# Patient Record
Sex: Female | Born: 1960
Health system: Southern US, Community
[De-identification: ages and names within clinical notes are randomized; demographics above are authoritative.]

## PROBLEM LIST (undated history)

## (undated) DIAGNOSIS — J45990 Exercise induced bronchospasm: Secondary | ICD-10-CM

## (undated) DIAGNOSIS — J45909 Unspecified asthma, uncomplicated: Secondary | ICD-10-CM

## (undated) DIAGNOSIS — C801 Malignant (primary) neoplasm, unspecified: Secondary | ICD-10-CM

## (undated) DIAGNOSIS — I10 Essential (primary) hypertension: Secondary | ICD-10-CM

## (undated) DIAGNOSIS — C569 Malignant neoplasm of unspecified ovary: Secondary | ICD-10-CM

## (undated) DIAGNOSIS — J309 Allergic rhinitis, unspecified: Secondary | ICD-10-CM

## (undated) HISTORY — PX: TONSILLECTOMY: SUR1361

## (undated) HISTORY — DX: Exercise induced bronchospasm: J45.990

## (undated) HISTORY — PX: ABLATION: SHX5711

## (undated) HISTORY — DX: Allergic rhinitis, unspecified: J30.9

## (undated) HISTORY — PX: APPENDECTOMY: SHX54

## (undated) HISTORY — DX: Malignant neoplasm of unspecified ovary: C56.9

---

## 2006-05-09 ENCOUNTER — Encounter: Admission: RE | Admit: 2006-05-09 | Discharge: 2006-05-09 | Payer: Self-pay | Admitting: Family Medicine

## 2008-03-31 ENCOUNTER — Ambulatory Visit (HOSPITAL_COMMUNITY): Admission: RE | Admit: 2008-03-31 | Discharge: 2008-03-31 | Payer: Self-pay | Admitting: Obstetrics and Gynecology

## 2008-03-31 ENCOUNTER — Encounter (INDEPENDENT_AMBULATORY_CARE_PROVIDER_SITE_OTHER): Payer: Self-pay | Admitting: Obstetrics and Gynecology

## 2010-03-12 ENCOUNTER — Encounter: Payer: Self-pay | Admitting: Family Medicine

## 2010-06-05 LAB — COMPREHENSIVE METABOLIC PANEL
ALT: 20 U/L (ref 0–35)
AST: 20 U/L (ref 0–37)
Albumin: 3.8 g/dL (ref 3.5–5.2)
Alkaline Phosphatase: 57 U/L (ref 39–117)
BUN: 13 mg/dL (ref 6–23)
CO2: 26 mEq/L (ref 19–32)
Calcium: 9.1 mg/dL (ref 8.4–10.5)
Chloride: 107 mEq/L (ref 96–112)
Creatinine, Ser: 0.6 mg/dL (ref 0.4–1.2)
GFR calc Af Amer: >60 mL/min (ref >60)
GFR calc non Af Amer: >60 mL/min (ref >60)
Glucose, Bld: 115 mg/dL — ABNORMAL HIGH (ref 70–99)
Potassium: 3.9 mEq/L (ref 3.5–5.1)
Sodium: 140 mEq/L (ref 135–145)
Total Bilirubin: 0.5 mg/dL (ref 0.3–1.2)
Total Protein: 6.7 g/dL (ref 6.0–8.3)

## 2010-06-05 LAB — CBC
HCT: 39.7 % (ref 36.0–46.0)
Hemoglobin: 13.4 g/dL (ref 12.0–15.0)
MCHC: 33.9 g/dL (ref 30.0–36.0)
MCV: 92.7 fL (ref 78.0–100.0)
Platelets: 192 10*3/uL (ref 150–400)
RBC: 4.28 MIL/uL (ref 3.87–5.11)
RDW: 13.1 % (ref 11.5–15.5)
WBC: 5.6 10*3/uL (ref 4.0–10.5)

## 2010-06-05 LAB — HCG, SERUM, QUALITATIVE: Preg, Serum: NEGATIVE

## 2010-07-03 NOTE — Op Note (Signed)
Morgan Olsen                    ACCOUNT NO.:  [id number]   MEDICAL RECORD NO.:  [id number]          PATIENT TYPE:  AMB   LOCATION:  SDC                           FACILITY:  [facility]   PHYSICIAN:  Guy Sandifer. Henderson Cloud, M.D. DATE OF BIRTH:  16-Feb-1961   DATE OF PROCEDURE:  03/31/2008  DATE OF DISCHARGE:                               OPERATIVE REPORT   PREOPERATIVE DIAGNOSES:  1. Menorrhagia.  2. Desires permanent sterilization.   POSTOPERATIVE DIAGNOSES:  1. Menorrhagia.  2. Desires permanent sterilization.  3. Endometriosis.   PROCEDURES:  NovaSure endometrial ablation, hysteroscopy with resection  of endometrial masses, dilatation and curettage, laparoscopy with  bilateral tubal ligation with Filshie clips, and ablation of  endometriosis.   SURGEON:  Guy Sandifer. Henderson Cloud, MD   ANESTHESIA:  General endotracheal intubation, Algie Coffer, MD   SPECIMENS:  Endometrial resections and endometrial curettings, both to  pathology.   ESTIMATED BLOOD LOSS:  Minimal.   I and Os of distending media 190 mL deficit with a lot of that being on  the floor.   INDICATIONS AND CONSENT:  This patient is a 50 year old married white  female G1, P1 with increasingly heavy menses.  Details are dictated in  history and physical.  After a thorough discussion of options, she is  being admitted for hysteroscopy with resectoscope, dilatation curettage,  NovaSure endometrial ablation, laparoscopy, and tubal ligation with  Filshie clips.  Potential risks and complications of the above has been  discussed preoperatively including, but not limited to infection,  uterine perforation, organ damage, bleeding requiring transfusion of  blood products with HIV and hepatitis acquisition, DVT, PE, pneumonia,  laparotomy, postoperative pain, or dyspareunia.  Success and failure  rate of the ablation has been reviewed.  The permanence of tubal  ligation failure rate and increased ectopic risk has also been reviewed.  All  questions have been answered and consent is signed on the chart.   FINDINGS:  Upper abdomen has a single omental adhesion above the level  of the umbilicus.  Uterus is about 6 weeks in size.  Fallopian tubes and  ovaries are normal bilaterally.  There are multiple white puckered  implants with small black 3-mm endometrial implants on the left pelvic  sidewall and on the left side of the posterior cul-de-sac.  In the  uterus, there are multiple polypoid-type structures along the posterior  endometrial canal.   PROCEDURE:  The patient was taken to operating room where she was  identified, placed in dorsosupine position, and general anesthesia was  induced via endotracheal intubation.  She was then placed in a dorsal  lithotomy position where she was prepped abdominally and vaginally,  straight catheterized, and draped in a sterile fashion.  Bivalve  speculum was placed in the vagina and anterior cervical lip was injected  with 0.5% plain Marcaine and grasped with a single-tooth tenaculum.  Paracervical block was placed at 2, 4, 5, 7, 8, and 10 o'clock positions  with approximately 20 mL of the same solution.  The uterus sounds to 10  cm and the cervix sounds  to 4 cm.  Cervix was gently progressively  dilated.  The resectoscope was placed in the endocervical canal and  advanced under direct visualization with distending media.  The above  findings were noted.  The polypoid-type structures were resected with a  single right-angle wire loop.  These were all sent separately to  Pathology.  Sharp curettage was then carried out.  Re-inspection with  the hysteroscope again advanced under direct visualization revealed the  cavity to be clean.  The NovaSure device was then placed.  The cavity  test was passed on the first attempt.  Ablation was then carried out and  completed at 1 minute and 21 seconds.  NovaSure device was removed and  inspection revealed it to be intact.  The hysteroscope was  replaced in  the endocervical canal and again advanced under direct visualization.  There was a good cautery effect throughout the uterus and no evidence of  perforation.  The single-tooth tenaculum was replaced with a Hulka  tenaculum and attention was turned to the abdomen.  The infraumbilical  and suprapubic areas were injected in the midline with 0.5% plain  Marcaine.  A small infraumbilical incision was made and a disposable  Veress needle was placed.  A normal syringe and drop test were noted.  Two liters of gas were then insufflated under low pressure with good  tympany in the right upper quadrant.  Veress needle was removed and a  10/11 XL bladeless disposable trocar sleeve was placed using direct  visualization with the diagnostic laparoscope after the operative  laparoscope was placed.  Small suprapubic incision was made in the  midline and a 5-mm XL bladeless disposable trocar sleeve was placed  under direct visualization without difficulty.  The above findings were  noted.  Filshie clip applicator was then placed and a Filshie clip was  placed in the proximal one-third of the tubes bilaterally.  The  applicator was then removed and careful inspection revealed the entire  width of the tube to be within the clip and the heel of the clip to be  visualized through the mesosalpinx.  Then, bipolar cautery was used to  cauterize the endometriosis on the left pelvic sidewall and left cul-de-  sac.  The course of the left ureter was carefully identified and no  cautery was done over the course of the ureter.  Good hemostasis was  noted all around.  Five mL of 0.5% plain Marcaine was then instilled  into the peritoneal cavity.  All instruments were removed and  pneumoperitoneum was then reduced.  Trocar sleeves were removed.  The  umbilical incision was closed with 0-Vicryl suture and the deeper  underlying layers with good visualization.  Both skin incisions were  closed with  subcuticular 2-0 Vicryl suture.  Dermabond was placed on  both incisions.  Hulka tenaculum was removed and no bleeding was noted.  All counts were correct.  The patient was awakened and taken to recovery  room in stable condition.      Guy Sandifer Henderson Cloud, M.D.  Electronically Signed     JET/MEDQ  D:  03/31/2008  T:  03/31/2008  Job:  [id number]

## 2010-07-03 NOTE — H&P (Signed)
NAMERENAY, CRAMMER                    ACCOUNT NO.:  [id number]   MEDICAL RECORD NO.:  [id number]          PATIENT TYPE:  AMB   LOCATION:  SDC                           FACILITY:  [facility]   PHYSICIAN:  Guy Sandifer. Henderson Cloud, M.D. DATE OF BIRTH:  August 12, 1960   DATE OF ADMISSION:  DATE OF DISCHARGE:                              HISTORY & PHYSICAL   For admission at [facility] [place], [place] on March 31, 2008.   CHIEF COMPLAINT:  Heavy menses and desires permanent sterilization.   HISTORY OF PRESENT ILLNESS:  This patient is a 50 year old married white  female G1, P1 who has increasingly heavy menses.  She will have 3 days  of heavy bleeding changing a pad plus a tampon every 2-3 hours.  She is  overflowing at night.  Ultrasound in my office on January 07, 2008,  revealed a uterus measuring 10.0 x 5.7 x 6.5 cm.  There is a 2.0-cm  simple cyst to the right ovary.  Sonohysterogram revealed 17 and 8-mm  polypoid-type structures within the endometrial cavity.  After  discussion of options, she is being admitted for hysteroscopy with  resectoscope dilatation and curettage, NovaSure endometrial ablation,  laparoscopy with bilateral tubal ligation with Filshie clips.  Potential  risks and complications, success and failure rates, permanence of the  above procedures has been reviewed preoperatively.   PAST MEDICAL HISTORY:  1. Asthma.  2. Chronic hypertension.  3. History of melanoma in situ.   MEDICATIONS:  Generic Lotrel, Singulair daily.   ALLERGIES:  SULFA medicines leading to rash.  The patient states that  she has been diagnosed with PEANUT allergy, although she is able to eat  peanut better with no troubles.   PAST SURGICAL HISTORY:  Tonsillectomy and wisdom tooth extraction.   OBSTETRICAL HISTORY:  Cesarean section x1.   FAMILY HISTORY:  Chronic hypertension in father, thyroid cancer in  mother.   SOCIAL HISTORY:  Denies tobacco, alcohol, or drug  abuse.   REVIEW OF SYSTEMS:  NEURO:  Denies headache.  CARDIO:  Denies chest  pain.  PULMONARY:  Denies shortness of breath.   PHYSICAL EXAMINATION:  VITAL SIGNS:  Height 5 feet 10 inches, weight 185  pounds, blood pressure 138/90.  LUNGS:  Clear to auscultation.  HEART:  Regular rate and rhythm.  ABDOMEN:  Soft, nontender without masses.  PELVIS:  Vulva, vagina, and cervix without lesion.  Uterus is midplane.  Adnexa nontender without palpable masses.  EXTREMITIES:  Grossly within  normal limits.  NEUROLOGICAL:  Exam grossly within normal limits.   ASSESSMENT:  Menorrhagia and desires permanent sterilization.   PLAN:  Hysteroscopy, resectoscope dilatation and curettage, NovaSure  endometrial ablation, and laparoscopy with bilateral tubal ligation with  Filshie clips.      Guy Sandifer Henderson Cloud, M.D.  Electronically Signed     JET/MEDQ  D:  03/28/2008  T:  03/29/2008  Job:  [id number]

## 2012-07-08 ENCOUNTER — Encounter (HOSPITAL_BASED_OUTPATIENT_CLINIC_OR_DEPARTMENT_OTHER): Payer: Self-pay | Admitting: *Deleted

## 2012-07-08 ENCOUNTER — Emergency Department (HOSPITAL_BASED_OUTPATIENT_CLINIC_OR_DEPARTMENT_OTHER)
Admission: EM | Admit: 2012-07-08 | Discharge: 2012-07-08 | Disposition: A | Discharge status: Home / Self Care | Payer: BC Managed Care – PPO | Attending: Emergency Medicine | Admitting: Emergency Medicine

## 2012-07-08 ENCOUNTER — Emergency Department (HOSPITAL_BASED_OUTPATIENT_CLINIC_OR_DEPARTMENT_OTHER): Payer: BC Managed Care – PPO

## 2012-07-08 DIAGNOSIS — Z79899 Other long term (current) drug therapy: Secondary | ICD-10-CM | POA: Insufficient documentation

## 2012-07-08 DIAGNOSIS — J45909 Unspecified asthma, uncomplicated: Secondary | ICD-10-CM | POA: Insufficient documentation

## 2012-07-08 DIAGNOSIS — N839 Noninflammatory disorder of ovary, fallopian tube and broad ligament, unspecified: Secondary | ICD-10-CM | POA: Insufficient documentation

## 2012-07-08 DIAGNOSIS — N838 Other noninflammatory disorders of ovary, fallopian tube and broad ligament: Secondary | ICD-10-CM

## 2012-07-08 DIAGNOSIS — I10 Essential (primary) hypertension: Secondary | ICD-10-CM | POA: Insufficient documentation

## 2012-07-08 DIAGNOSIS — Z859 Personal history of malignant neoplasm, unspecified: Secondary | ICD-10-CM | POA: Insufficient documentation

## 2012-07-08 HISTORY — DX: Essential (primary) hypertension: I10

## 2012-07-08 HISTORY — DX: Unspecified asthma, uncomplicated: J45.909

## 2012-07-08 HISTORY — DX: Malignant (primary) neoplasm, unspecified: C80.1

## 2012-07-08 LAB — CBC WITH DIFFERENTIAL/PLATELET
Basophils Absolute: 0 10*3/uL (ref 0.0–0.1)
Basophils Relative: 0 % (ref 0–1)
Eosinophils Absolute: 0 10*3/uL (ref 0.0–0.7)
Eosinophils Relative: 0 % (ref 0–5)
HCT: 39.1 % (ref 36.0–46.0)
Hemoglobin: 13.4 g/dL (ref 12.0–15.0)
Lymphocytes Relative: 12 % (ref 12–46)
Lymphs Abs: 1.1 10*3/uL (ref 0.7–4.0)
MCH: 31.1 pg (ref 26.0–34.0)
MCHC: 34.3 g/dL (ref 30.0–36.0)
MCV: 90.7 fL (ref 78.0–100.0)
Monocytes Absolute: 0.3 10*3/uL (ref 0.1–1.0)
Monocytes Relative: 3 % (ref 3–12)
Neutro Abs: 7.7 10*3/uL (ref 1.7–7.7)
Neutrophils Relative %: 85 % — ABNORMAL HIGH (ref 43–77)
Platelets: 217 10*3/uL (ref 150–400)
RBC: 4.31 MIL/uL (ref 3.87–5.11)
RDW: 12.2 % (ref 11.5–15.5)
WBC: 9 10*3/uL (ref 4.0–10.5)

## 2012-07-08 LAB — URINALYSIS, ROUTINE W REFLEX MICROSCOPIC
Bilirubin Urine: NEGATIVE
Glucose, UA: NEGATIVE mg/dL
Hgb urine dipstick: NEGATIVE
Ketones, ur: 40 mg/dL — AB
Leukocytes, UA: NEGATIVE
Nitrite: NEGATIVE
Protein, ur: NEGATIVE mg/dL
Specific Gravity, Urine: 1.026 (ref 1.005–1.030)
Urobilinogen, UA: 1 mg/dL (ref 0.0–1.0)
pH: 5.5 (ref 5.0–8.0)

## 2012-07-08 LAB — COMPREHENSIVE METABOLIC PANEL
ALT: 16 U/L (ref 0–35)
AST: 15 U/L (ref 0–37)
Albumin: 3.8 g/dL (ref 3.5–5.2)
Alkaline Phosphatase: 73 U/L (ref 39–117)
BUN: 13 mg/dL (ref 6–23)
CO2: 26 mEq/L (ref 19–32)
Calcium: 9.7 mg/dL (ref 8.4–10.5)
Chloride: 100 mEq/L (ref 96–112)
Creatinine, Ser: 0.7 mg/dL (ref 0.50–1.10)
GFR calc Af Amer: >90 mL/min (ref >90)
GFR calc non Af Amer: >90 mL/min (ref >90)
Glucose, Bld: 112 mg/dL — ABNORMAL HIGH (ref 70–99)
Potassium: 3.8 mEq/L (ref 3.5–5.1)
Sodium: 138 mEq/L (ref 135–145)
Total Bilirubin: 0.3 mg/dL (ref 0.3–1.2)
Total Protein: 7 g/dL (ref 6.0–8.3)

## 2012-07-08 LAB — LIPASE, BLOOD: Lipase: 21 U/L (ref 11–59)

## 2012-07-08 MED ORDER — HYDROCODONE-ACETAMINOPHEN 5-325 MG PO TABS: 2.0000 | ORAL_TABLET | ORAL | Status: DC | PRN

## 2012-07-08 MED ORDER — IOHEXOL 300 MG/ML  SOLN
100.0000 mL | Freq: Once | INTRAMUSCULAR | Status: AC | PRN
  Administered 2012-07-08: 100 mL via INTRAVENOUS

## 2012-07-08 MED ORDER — LORAZEPAM 1 MG PO TABS: 1.0000 mg | ORAL_TABLET | Freq: Three times a day (TID) | ORAL | Status: DC | PRN

## 2012-07-08 MED ORDER — SODIUM CHLORIDE 0.9 % IV SOLN
Freq: Once | INTRAVENOUS | Status: AC
  Administered 2012-07-08: 18:00:00 via INTRAVENOUS

## 2012-07-08 MED ORDER — ONDANSETRON 4 MG PO TBDP: 4.0000 mg | ORAL_TABLET | Freq: Three times a day (TID) | ORAL | Status: DC | PRN

## 2012-07-08 MED ORDER — LORAZEPAM 1 MG PO TABS
1.0000 mg | ORAL_TABLET | Freq: Once | ORAL | Status: AC
  Administered 2012-07-08: 1 mg via ORAL
  Filled 2012-07-08: qty 1

## 2012-07-08 MED ORDER — IOHEXOL 300 MG/ML  SOLN
50.0000 mL | Freq: Once | INTRAMUSCULAR | Status: AC | PRN
  Administered 2012-07-08: 50 mL via ORAL

## 2012-07-08 NOTE — ED Notes (Signed)
Pt c/o severe abd pain x 1 hr with nausea only

## 2012-07-08 NOTE — ED Provider Notes (Signed)
History     CSN: [id number]  Arrival date & time 07/08/12  1553   First MD Initiated Contact with Patient 07/08/12 1615      Chief Complaint  Patient presents with  . Abdominal Pain    (Consider location/radiation/quality/duration/timing/severity/associated sxs/prior treatment) Patient is a 52 y.o. female presenting with abdominal pain. The history is provided by the patient. No language interpreter was used.  Abdominal Pain This is a new problem. The current episode started today. The problem occurs constantly. The problem has been gradually improving. Associated symptoms include abdominal pain. Nothing aggravates the symptoms. She has tried nothing for the symptoms.  Pt complains of severe left lower abdominal pain for the last hour.  Pt reports she has had similar episodes in the past.  Pt reports gi illness 2 months ago,  Pt reports abdominal swelling and bloating since.  Pt reports pain is starting to resolve.    Past Medical History  Diagnosis Date  . Hypertension   . Asthma   . Cancer     Past Surgical History  Procedure Laterality Date  . Cesarean section    . Tonsillectomy    . Ablation      History reviewed. No pertinent family history.  History  Substance Use Topics  . Smoking status: Never Smoker   . Smokeless tobacco: Not on file  . Alcohol Use: No    OB History   Grav Para Term Preterm Abortions TAB SAB Ect Mult Living                  Review of Systems  Gastrointestinal: Positive for abdominal pain.  All other systems reviewed and are negative.    Allergies  Sulfa antibiotics  Home Medications   Current Outpatient Rx  Name  Route  Sig  Dispense  Refill  . amLODipine-benazepril (LOTREL) 5-40 MG per capsule   Oral   Take 1 capsule by mouth daily.         . montelukast (SINGULAIR) 10 MG tablet   Oral   Take 10 mg by mouth at bedtime.           BP 130/83  Pulse 91  Temp(Src) 98.2 F (36.8 C) (Oral)  Resp 16  Ht 5\' 10"  (1.778  m)  Wt 177 lb (80.287 kg)  BMI 25.4 kg/m2  SpO2 100%  Physical Exam  Nursing note and vitals reviewed. Constitutional: She appears well-developed and well-nourished.  HENT:  Head: Normocephalic.  Right Ear: External ear normal.  Left Ear: External ear normal.  Nose: Nose normal.  Mouth/Throat: Oropharynx is clear and moist.  Eyes: Conjunctivae are normal. Pupils are equal, round, and reactive to light.  Neck: Normal range of motion. Neck supple.  Cardiovascular: Normal rate and regular rhythm.   Pulmonary/Chest: Effort normal and breath sounds normal.  Abdominal: Soft. There is tenderness.  Musculoskeletal: Normal range of motion.  Neurological: She is alert.  Skin: Skin is warm.  Psychiatric: She has a normal mood and affect.    ED Course  Procedures (including critical care time)  Labs Reviewed  URINALYSIS, ROUTINE W REFLEX MICROSCOPIC - Abnormal; Notable for the following:    Color, Urine AMBER (*)    APPearance CLOUDY (*)    Ketones, ur 40 (*)    All other components within normal limits  CBC WITH DIFFERENTIAL - Abnormal; Notable for the following:    Neutrophils Relative % 85 (*)    All other components within normal limits  COMPREHENSIVE METABOLIC PANEL -  Abnormal; Notable for the following:    Glucose, Bld 112 (*)    All other components within normal limits  LIPASE, BLOOD   No results found.   No diagnosis found.    MDM   Results for orders placed during the hospital encounter of 07/08/12  URINALYSIS, ROUTINE W REFLEX MICROSCOPIC      Result Value Range   Color, Urine AMBER (*) YELLOW   APPearance CLOUDY (*) CLEAR   Specific Gravity, Urine 1.026  1.005 - 1.030   pH 5.5  5.0 - 8.0   Glucose, UA NEGATIVE  NEGATIVE mg/dL   Hgb urine dipstick NEGATIVE  NEGATIVE   Bilirubin Urine NEGATIVE  NEGATIVE   Ketones, ur 40 (*) NEGATIVE mg/dL   Protein, ur NEGATIVE  NEGATIVE mg/dL   Urobilinogen, UA 1.0  0.0 - 1.0 mg/dL   Nitrite NEGATIVE  NEGATIVE    Leukocytes, UA NEGATIVE  NEGATIVE  CBC WITH DIFFERENTIAL      Result Value Range   WBC 9.0  4.0 - 10.5 K/uL   RBC 4.31  3.87 - 5.11 MIL/uL   Hemoglobin 13.4  12.0 - 15.0 g/dL   HCT 16.1  09.6 - 04.5 %   MCV 90.7  78.0 - 100.0 fL   MCH 31.1  26.0 - 34.0 pg   MCHC 34.3  30.0 - 36.0 g/dL   RDW 40.9  81.1 - 91.4 %   Platelets 217  150 - 400 K/uL   Neutrophils Relative % 85 (*) 43 - 77 %   Neutro Abs 7.7  1.7 - 7.7 K/uL   Lymphocytes Relative 12  12 - 46 %   Lymphs Abs 1.1  0.7 - 4.0 K/uL   Monocytes Relative 3  3 - 12 %   Monocytes Absolute 0.3  0.1 - 1.0 K/uL   Eosinophils Relative 0  0 - 5 %   Eosinophils Absolute 0.0  0.0 - 0.7 K/uL   Basophils Relative 0  0 - 1 %   Basophils Absolute 0.0  0.0 - 0.1 K/uL  COMPREHENSIVE METABOLIC PANEL      Result Value Range   Sodium 138  135 - 145 mEq/L   Potassium 3.8  3.5 - 5.1 mEq/L   Chloride 100  96 - 112 mEq/L   CO2 26  19 - 32 mEq/L   Glucose, Bld 112 (*) 70 - 99 mg/dL   BUN 13  6 - 23 mg/dL   Creatinine, Ser 7.82  0.50 - 1.10 mg/dL   Calcium 9.7  8.4 - 95.6 mg/dL   Total Protein 7.0  6.0 - 8.3 g/dL   Albumin 3.8  3.5 - 5.2 g/dL   AST 15  0 - 37 U/L   ALT 16  0 - 35 U/L   Alkaline Phosphatase 73  39 - 117 U/L   Total Bilirubin 0.3  0.3 - 1.2 mg/dL   GFR calc non Af Amer >90  >90 mL/min   GFR calc Af Amer >90  >90 mL/min  LIPASE, BLOOD      Result Value Range   Lipase 21  11 - 59 U/L   Ct Abdomen Pelvis W Contrast  07/08/2012   *RADIOLOGY REPORT*  Clinical Data: Infrapubic pelvic pain  CT ABDOMEN AND PELVIS WITH CONTRAST  Technique:  Multidetector CT imaging of the abdomen and pelvis was performed following the standard protocol during bolus administration of intravenous contrast.  Contrast: 100 ml Omnipaque-300 IV  Comparison: None.  Findings: Lung bases are essentially  clear.  Small hiatal hernia.  Liver, spleen, pancreas, and adrenal glands are within normal limits.  Gallbladder is underdistended.  No intrahepatic or extrahepatic  ductal dilatation.  Kidneys are within normal limits.  No hydronephrosis.  No evidence of bowel obstruction.  Normal appendix.  Transverse and descending colon are decompressed.  No evidence of abdominal aortic aneurysm.  Small retroperitoneal lymph nodes which do not meet pathologic CT size criteria.  11.6 x 10.1 x 10.1 cm complex cystic left ovarian mass (series 2/image 70) with associated mural nodularity/soft tissue component, worrisome for malignant ovarian neoplasm.  Associated mild mesenteric/omental stranding and small volume abdominopelvic ascites.  Possible mild peritoneal nodularity in the right lower abdomen, measuring 7 mm (series 2/image 70), worrisome for peritoneal disease.  Uterus and right ovary are grossly unremarkable.  Bladder is within normal limits.  Degenerative changes of the visualized thoracolumbar spine.  IMPRESSION: 11.6 cm left ovarian mass, worrisome for malignant ovarian neoplasm.  Associated small volume abdominopelvic ascites and possible mild peritoneal nodularity, worrisome for peritoneal disease.  These results were called by telephone on 07/08/2012 at 1820 hours to Dr Ranae Palms, who verbally acknowledged these results.   Original Report Authenticated By: Charline Bills, M.D.     I spoke to Dr. Marcelle Overlie who is on call for Dr. [name].  He advised have pt call the office in the am to schedule to see Dr. [name].   Pt given Rx for hydrocodone for pain, zofran for nausea.   Pt is advised if symptoms worsen of change she needs to go to [facility] hospital for evaluation.      Lonia Skinner [place], PA-C 07/08/12 1909

## 2012-07-08 NOTE — ED Notes (Signed)
PA at bedside discussing DC plan of care.  

## 2012-07-09 NOTE — ED Provider Notes (Signed)
Medical screening examination/treatment/procedure(s) were performed by non-physician practitioner and as supervising physician I was immediately available for consultation/collaboration.   Ivi Griffith, MD 07/09/12 1858 

## 2012-07-15 ENCOUNTER — Ambulatory Visit: Payer: BC Managed Care – PPO | Admitting: Gynecology

## 2012-07-15 DIAGNOSIS — C569 Malignant neoplasm of unspecified ovary: Secondary | ICD-10-CM | POA: Insufficient documentation

## 2012-07-15 HISTORY — PX: ABDOMINAL HYSTERECTOMY: SHX81

## 2012-07-27 ENCOUNTER — Other Ambulatory Visit: Payer: Self-pay | Admitting: Gynecologic Oncology

## 2012-07-27 DIAGNOSIS — C569 Malignant neoplasm of unspecified ovary: Secondary | ICD-10-CM

## 2012-07-27 NOTE — Progress Notes (Signed)
Arranged per Dr. Forrestine Him request.  Patient would like to have cycle #1 at [facility] this week  (port placement on 6/11 and tax/carb on 6/12), but see Dr. Welton Flakes on 6/17 for future visits per [facility] staff.

## 2012-07-28 ENCOUNTER — Telehealth: Payer: Self-pay | Admitting: Oncology

## 2012-07-28 NOTE — Telephone Encounter (Signed)
07/28/12 for appt. 08/04/12

## 2012-07-31 ENCOUNTER — Other Ambulatory Visit: Payer: Self-pay | Admitting: Emergency Medicine

## 2012-08-03 ENCOUNTER — Other Ambulatory Visit: Payer: Self-pay | Admitting: Emergency Medicine

## 2012-08-03 DIAGNOSIS — C569 Malignant neoplasm of unspecified ovary: Secondary | ICD-10-CM

## 2012-08-04 ENCOUNTER — Ambulatory Visit: Payer: BC Managed Care – PPO

## 2012-08-04 ENCOUNTER — Telehealth: Payer: Self-pay | Admitting: *Deleted

## 2012-08-04 ENCOUNTER — Other Ambulatory Visit: Payer: Self-pay | Admitting: Emergency Medicine

## 2012-08-04 ENCOUNTER — Encounter: Payer: Self-pay | Admitting: Oncology

## 2012-08-04 ENCOUNTER — Other Ambulatory Visit (HOSPITAL_BASED_OUTPATIENT_CLINIC_OR_DEPARTMENT_OTHER): Payer: BC Managed Care – PPO | Admitting: Lab

## 2012-08-04 ENCOUNTER — Ambulatory Visit (HOSPITAL_BASED_OUTPATIENT_CLINIC_OR_DEPARTMENT_OTHER): Payer: BC Managed Care – PPO | Admitting: Oncology

## 2012-08-04 VITALS — BP 118/86 | HR 99 | Temp 98.1°F | Resp 20 | Ht 70.0 in | Wt 166.7 lb

## 2012-08-04 DIAGNOSIS — J45990 Exercise induced bronchospasm: Secondary | ICD-10-CM | POA: Insufficient documentation

## 2012-08-04 DIAGNOSIS — I10 Essential (primary) hypertension: Secondary | ICD-10-CM

## 2012-08-04 DIAGNOSIS — C569 Malignant neoplasm of unspecified ovary: Secondary | ICD-10-CM

## 2012-08-04 DIAGNOSIS — C562 Malignant neoplasm of left ovary: Secondary | ICD-10-CM

## 2012-08-04 HISTORY — DX: Essential (primary) hypertension: I10

## 2012-08-04 HISTORY — DX: Exercise induced bronchospasm: J45.990

## 2012-08-04 LAB — CBC WITH DIFFERENTIAL/PLATELET
BASO%: 1.4 % (ref 0.0–2.0)
Basophils Absolute: 0 10*3/uL (ref 0.0–0.1)
EOS%: 0 % (ref 0.0–7.0)
Eosinophils Absolute: 0 10*3/uL (ref 0.0–0.5)
HCT: 39.1 % (ref 34.8–46.6)
HGB: 13.2 g/dL (ref 11.6–15.9)
LYMPH%: 45.8 % (ref 14.0–49.7)
MCH: 30.1 pg (ref 25.1–34.0)
MCHC: 33.7 g/dL (ref 31.5–36.0)
MCV: 89.4 fL (ref 79.5–101.0)
MONO#: 0.1 10*3/uL (ref 0.1–0.9)
MONO%: 1.6 % (ref 0.0–14.0)
NEUT#: 1.8 10*3/uL (ref 1.5–6.5)
NEUT%: 51.2 % (ref 38.4–76.8)
Platelets: 266 10*3/uL (ref 145–400)
RBC: 4.37 10*6/uL (ref 3.70–5.45)
RDW: 13.5 % (ref 11.2–14.5)
WBC: 3.5 10*3/uL — ABNORMAL LOW (ref 3.9–10.3)
lymph#: 1.6 10*3/uL (ref 0.9–3.3)

## 2012-08-04 LAB — COMPREHENSIVE METABOLIC PANEL (CC13)
ALT: 38 U/L (ref 0–55)
AST: 23 U/L (ref 5–34)
Albumin: 4 g/dL (ref 3.5–5.0)
Alkaline Phosphatase: 67 U/L (ref 40–150)
BUN: 19.2 mg/dL (ref 7.0–26.0)
CO2: 26 mEq/L (ref 22–29)
Calcium: 10.6 mg/dL — ABNORMAL HIGH (ref 8.4–10.4)
Chloride: 99 mEq/L (ref 98–107)
Creatinine: 0.8 mg/dL (ref 0.6–1.1)
Glucose: 104 mg/dl — ABNORMAL HIGH (ref 70–99)
Potassium: 4.3 mEq/L (ref 3.5–5.1)
Sodium: 136 mEq/L (ref 136–145)
Total Bilirubin: 0.51 mg/dL (ref 0.20–1.20)
Total Protein: 7.5 g/dL (ref 6.4–8.3)

## 2012-08-04 MED ORDER — PROCHLORPERAZINE MALEATE 10 MG PO TABS: 10.0000 mg | ORAL_TABLET | Freq: Four times a day (QID) | ORAL | Status: DC | PRN

## 2012-08-04 MED ORDER — ONDANSETRON HCL 8 MG PO TABS: 8.0000 mg | ORAL_TABLET | Freq: Two times a day (BID) | ORAL | Status: DC

## 2012-08-04 MED ORDER — LORAZEPAM 0.5 MG PO TABS: 0.5000 mg | ORAL_TABLET | Freq: Four times a day (QID) | ORAL | Status: DC | PRN

## 2012-08-04 MED ORDER — LIDOCAINE-PRILOCAINE 2.5-2.5 % EX CREA: TOPICAL_CREAM | CUTANEOUS | Status: DC | PRN

## 2012-08-04 MED ORDER — VENLAFAXINE HCL ER 37.5 MG PO CP24: 37.5000 mg | ORAL_CAPSULE | Freq: Every day | ORAL | Status: DC

## 2012-08-04 MED ORDER — PROCHLORPERAZINE 25 MG RE SUPP: 25.0000 mg | Freq: Two times a day (BID) | RECTAL | Status: DC | PRN

## 2012-08-04 MED ORDER — ALPRAZOLAM 0.25 MG PO TABS: 0.2500 mg | ORAL_TABLET | Freq: Every evening | ORAL | Status: DC | PRN

## 2012-08-04 MED ORDER — GABAPENTIN 100 MG PO CAPS: 100.0000 mg | ORAL_CAPSULE | Freq: Three times a day (TID) | ORAL | Status: DC

## 2012-08-04 MED ORDER — OXYCODONE HCL 5 MG PO CAPS: 5.0000 mg | ORAL_CAPSULE | Freq: Four times a day (QID) | ORAL | Status: DC | PRN

## 2012-08-04 MED ORDER — DEXAMETHASONE 4 MG PO TABS: 8.0000 mg | ORAL_TABLET | Freq: Two times a day (BID) | ORAL | Status: DC

## 2012-08-04 NOTE — Telephone Encounter (Signed)
appts made and printed. Pt is aware that im waiting on KK to give me a time slot for 08/20/12. Pt is aware that i emailed KK for a time slot., and I emailed MW to add tx. i also informed pt that i will giver her a call when everything was complete and mail a new AVS..td

## 2012-08-04 NOTE — Progress Notes (Signed)
[facility]  Telephone:([phone] Fax:([phone]   MEDICAL ONCOLOGY - INITIAL CONSULATION    Referral MD   Dr. Toniann Fail. 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 New Evaluation  : 07/15/2012  :Stage II B. carcinosarcoma of the ovary  Status post surgery on 07/15/2012 consisting of total on a hysterectomy bilateral salpingo-oophorectomy, ovarian cancer staging, appendectomy   HPI: Patient is a very pleasant 52 year old female who is seen today square see from Dr. [name]. Patient originally presented to the ER with pelvic pain in May 2014. She states that she first noticed this pain briefly in 2012 but it was self-limited. She never saw any care. She however continued to have occasional episodes of debilitating pelvic pain that will go if after a few minutes. However in May she had an episode of this pain was acute onset severe lower abdominal/pelvic pain accompanied by nausea and cold sweats. It did not resolve spontaneously showed she presented to the ER for further evaluation. She had a CT scan performed in the emergency department that showed an 11 cm complex left ovarian mass with mild mesenteric/omental stranding, small ascites and peritoneal nodularity. Because of this she was referred to Dr. [name] who performed a transvaginal ultrasound confirming a 11.5 cm complex left ovarian mass no free fluid was noted and the uterus was normal. She had a CEA 125 checked and it was elevated at 1600. She subsequently was seen by Dr. [name] and she underwent on 07/15/2012 total [name] hysterectomy bilateral salpingo-oophorectomy ovarian cancer staging and appendectomy. Intraoperatively patient was found to have a 10 cm left ovary with a cyst containing or blood. The ovarian cyst was hard he ruptured upon entry to the abdomen. The frozen section revealed an  adenocarcinoma of the ovary. The left ovarian mass was adherent to the uterine fundus and the sigmoid colon mesentery. The left ureter was dilated to 1 cm prior to the mass removal. The pathology showed a carcinosarcoma, [name] is type, consisting of mixed endometrioid, clear cell, mucinous, and squamous cell carcinoma components and [name] sarcomatous component. Tumor necrosis and acute hemorrhage is present tumor size was 10.5 x 8.0 x 8.5 cm no ovarian surface involvement was identified no lymphovascular invasion identified. Left fallopian tube the serosal fibrovascular decisions were noted mild chronic salpingitis no tumor was seen. Tumor did involve left ovary and periureteral nodule only. Her surgery was performed at [facility]. She was seen by oncology there and patient was started on carboplatinum plus paclitaxel. Overall she tolerated it well. She is now transferring her care to [facility] for continuation of her adjuvant chemotherapy. Patient does have some psychosocial issues due to stress and anxiety management. She did have a cycle oncology consult obtained during her visits with diagnostic at [facility]. Today she is accompanied by her husband. They are both very anxious. They asked many questions which I answered to the best of my abilities. At the end of the consultation they were satisfied. She will be due for her next chemotherapy which would be cycle 2 of Taxol and carboplatinum on 08/20/2012.     Past Medical History  Diagnosis Date  . Hypertension   . Asthma   . Cancer   . Hypertension 08/04/2012  . Exercise-induced asthma 08/04/2012  :  Past Surgical History  Procedure Laterality Date  . Cesarean section    .  Tonsillectomy    . Ablation    :  Current Outpatient Prescriptions  Medication Sig Dispense Refill  . ALPRAZolam (XANAX) 0.25 MG tablet Take 0.25 mg by mouth at bedtime as needed for sleep.      Marland Kitchen amLODipine-benazepril (LOTREL) 5-40 MG per capsule Take 1 capsule by  mouth daily.      . DiphenhydrAMINE HCl (BENADRYL PO) Take by mouth.      . Enoxaparin Sodium (LOVENOX [place]) Inject 40 mg into the skin daily.      Marland Kitchen ibuprofen (ADVIL,MOTRIN) 800 MG tablet Take 800 mg by mouth every 8 (eight) hours as needed for pain.      . montelukast (SINGULAIR) 10 MG tablet Take 10 mg by mouth at bedtime.      . pyridOXINE (VITAMIN B-6) 100 MG tablet Take 200 mg by mouth daily.      Tery Sanfilippo Sodium (COLACE PO) Take by mouth.      . Furosemide (LASIX PO) Take by mouth.      Marland Kitchen HYDROcodone-acetaminophen (NORCO/VICODIN) 5-325 MG per tablet Take 2 tablets by mouth every 4 (four) hours as needed.  16 tablet  0  . LORazepam (ATIVAN) 1 MG tablet Take 1 tablet (1 mg total) by mouth 3 (three) times daily as needed for anxiety.  15 tablet  0  . ondansetron (ZOFRAN ODT) 4 MG disintegrating tablet Take 1 tablet (4 mg total) by mouth every 8 (eight) hours as needed for nausea.  10 tablet  0  . OXYCODONE HCL PO Take by mouth.      . Prochlorperazine Maleate (COMPAZINE PO) Take by mouth.      . Promethazine HCl (PHENERGAN PO) Take by mouth.       No current facility-administered medications for this visit.     Allergies  Allergen Reactions  . Sulfa Antibiotics Rash    Starts with muscle pains "feels like she has been hit with bat"  :  History reviewed. No pertinent family history.:  History   Social History  . Marital Status: Married    Spouse Name: N/A    Number of Children: N/A  . Years of Education: N/A   Occupational History  . Not on file.   Social History Main Topics  . Smoking status: Never Smoker   . Smokeless tobacco: Never Used  . Alcohol Use: No  . Drug Use: No  . Sexually Active: No   Other Topics Concern  . Not on file   Social History Narrative  . No narrative on file  :  A comprehensive review of systems was negative.  Exam: Filed Vitals:   08/04/12 1156  BP: 118/86  Pulse: 99  Temp: 98.1 F (36.7 C)  Resp: 20     General:   well-nourished in no acute distress.  Eyes:  no scleral icterus.  ENT:  There were no oropharyngeal lesions.  Neck was without thyromegaly.  Lymphatics:  Negative cervical, supraclavicular or axillary adenopathy.  Respiratory: lungs were clear bilaterally without wheezing or crackles.  Cardiovascular:  Regular rate and rhythm, S1/S2, without murmur, rub or gallop.  There was no pedal edema.  GI:  abdomen was soft, flat, nontender, nondistended, without organomegaly.  Muscoloskeletal:  no spinal tenderness of palpation of vertebral spine.  Skin exam was without echymosis, petichae.  Neuro exam was nonfocal.  Patient was able to get on and off exam table without assistance.  Gait was normal.  Patient was alerted and oriented.  Attention was good.  Language was appropriate.  Mood was normal without depression.  Speech was not pressured.  Thought content was not tangential.     Lab Results  Component Value Date   WBC 3.5* 08/04/2012   HGB 13.2 08/04/2012   HCT 39.1 08/04/2012   PLT 266 08/04/2012   GLUCOSE 104* 08/04/2012   ALT 38 08/04/2012   AST 23 08/04/2012   NA 136 08/04/2012   K 4.3 08/04/2012   CL 99 08/04/2012   CREATININE 0.8 08/04/2012   BUN 19.2 08/04/2012   CO2 26 08/04/2012    Ct Abdomen Pelvis W Contrast  07/08/2012   *RADIOLOGY REPORT*  Clinical Data: Infrapubic pelvic pain  CT ABDOMEN AND PELVIS WITH CONTRAST  Technique:  Multidetector CT imaging of the abdomen and pelvis was performed following the standard protocol during bolus administration of intravenous contrast.  Contrast: 100 ml Omnipaque-300 IV  Comparison: None.  Findings: Lung bases are essentially clear.  Small hiatal hernia.  Liver, spleen, pancreas, and adrenal glands are within normal limits.  Gallbladder is underdistended.  No intrahepatic or extrahepatic ductal dilatation.  Kidneys are within normal limits.  No hydronephrosis.  No evidence of bowel obstruction.  Normal appendix.  Transverse and descending colon are decompressed.   No evidence of abdominal aortic aneurysm.  Small retroperitoneal lymph nodes which do not meet pathologic CT size criteria.  11.6 x 10.1 x 10.1 cm complex cystic left ovarian mass (series 2/image 70) with associated mural nodularity/soft tissue component, worrisome for malignant ovarian neoplasm.  Associated mild mesenteric/omental stranding and small volume abdominopelvic ascites.  Possible mild peritoneal nodularity in the right lower abdomen, measuring 7 mm (series 2/image 70), worrisome for peritoneal disease.  Uterus and right ovary are grossly unremarkable.  Bladder is within normal limits.  Degenerative changes of the visualized thoracolumbar spine.  IMPRESSION: 11.6 cm left ovarian mass, worrisome for malignant ovarian neoplasm.  Associated small volume abdominopelvic ascites and possible mild peritoneal nodularity, worrisome for peritoneal disease.  These results were called by telephone on 07/08/2012 at 1820 hours to Dr Ranae Palms, who verbally acknowledged these results.   Original Report Authenticated By: Charline Bills, M.D.    Pathology: none at [facility]  Ct Abdomen Pelvis W Contrast  07/08/2012   *RADIOLOGY REPORT*  Clinical Data: Infrapubic pelvic pain  CT ABDOMEN AND PELVIS WITH CONTRAST  Technique:  Multidetector CT imaging of the abdomen and pelvis was performed following the standard protocol during bolus administration of intravenous contrast.  Contrast: 100 ml Omnipaque-300 IV  Comparison: None.  Findings: Lung bases are essentially clear.  Small hiatal hernia.  Liver, spleen, pancreas, and adrenal glands are within normal limits.  Gallbladder is underdistended.  No intrahepatic or extrahepatic ductal dilatation.  Kidneys are within normal limits.  No hydronephrosis.  No evidence of bowel obstruction.  Normal appendix.  Transverse and descending colon are decompressed.  No evidence of abdominal aortic aneurysm.  Small retroperitoneal lymph nodes which do not meet pathologic CT size criteria.   11.6 x 10.1 x 10.1 cm complex cystic left ovarian mass (series 2/image 70) with associated mural nodularity/soft tissue component, worrisome for malignant ovarian neoplasm.  Associated mild mesenteric/omental stranding and small volume abdominopelvic ascites.  Possible mild peritoneal nodularity in the right lower abdomen, measuring 7 mm (series 2/image 70), worrisome for peritoneal disease.  Uterus and right ovary are grossly unremarkable.  Bladder is within normal limits.  Degenerative changes of the visualized thoracolumbar spine.  IMPRESSION: 11.6 cm left ovarian mass, worrisome for malignant ovarian neoplasm.  Associated small volume abdominopelvic ascites and possible mild peritoneal nodularity, worrisome for peritoneal disease.  These results were called by telephone on 07/08/2012 at 1820 hours to Dr Ranae Palms, who verbally acknowledged these results.   Original Report Authenticated By: Charline Bills, M.D.    Assessment and Plan: 52 year old female with  #1 new diagnosis of stage IIB carcinosarcoma of the ovary. Patient is status post total abdominal hysterectomy, BSO, ovarian cancer staging, appendectomy. Patient also went on to receive chemotherapy consisting of carboplatinum plus paclitaxel. She has received one cycle. She is due to receive cycle 2 of her chemotherapy on 08/20/2012.  #2 patient and I and her husband had an extensive one-hour discussion regarding her diagnosis treatment options and prognosis. They are very anxious about the fact that this is a carcinosarcoma and not atypical ovarian cancer. They are also concerned about whether carboplatinum and paclitaxel are the standards of care. However they do understand that these are rare tumors and the need to be treated as an ovarian malignancy. I did recommend to them to continue doing the research and certainly if there are any clinical trials open that we would be more than happy to refer them to those. However at this time we need to  proceed with her scheduled treatments.  #3 we also discussed how to monitor her. We certainly will be doing CA 125 monitoring, we would also be getting surveillance CT scans once patient completes her 6 cycles of treatment.  #4 I will plan on seeing the patient back on 08/19/2012 and she will proceed with cycle 2 day 1 of Taxol carboplatinum on 08/20/2012. At this time she will not require Neulasta injections. However if she does drop her counts then certainly she would require G-CSF support.  The length of time of the face-to-face encounter was 60    minutes. More than 50% of time was spent counseling and coordination of care.  Drue Second, MD Medical/Oncology [facility] [phone] (beeper) [phone] (Office)

## 2012-08-04 NOTE — Progress Notes (Signed)
Checked in new pt with no financial concerns. °

## 2012-08-04 NOTE — Patient Instructions (Addendum)
We discussed your diagnosis and pathology and treatments given at [facility]  We discussed continuing treatment with taxol/carboplatin every 21 days starting on 08/20/12  We discussed supportive care including pain management, anxiety etc  Paclitaxel injection What is this medicine? PACLITAXEL (PAK li TAX el) is a chemotherapy drug. It targets fast dividing cells, like cancer cells, and causes these cells to die. This medicine is used to treat ovarian cancer, breast cancer, and other cancers. This medicine may be used for other purposes; ask your health care provider or pharmacist if you have questions. What should I tell my health care provider before I take this medicine? They need to know if you have any of these conditions: -blood disorders -irregular heartbeat -infection (especially a virus infection such as chickenpox, cold sores, or herpes) -liver disease -previous or ongoing radiation therapy -an unusual or allergic reaction to paclitaxel, alcohol, polyoxyethylated castor oil, other chemotherapy agents, other medicines, foods, dyes, or preservatives -pregnant or trying to get pregnant -breast-feeding How should I use this medicine? This drug is given as an infusion into a vein. It is administered in a hospital or clinic by a specially trained health care professional. Talk to your pediatrician regarding the use of this medicine in children. Special care may be needed. Overdosage: If you think you have taken too much of this medicine contact a poison control center or emergency room at once. NOTE: This medicine is only for you. Do not share this medicine with others. What if I miss a dose? It is important not to miss your dose. Call your doctor or health care professional if you are unable to keep an appointment. What may interact with this medicine? Do not take this medicine with any of the following medications: -disulfiram -metronidazole This medicine may also interact with the  following medications: -cyclosporine -dexamethasone -diazepam -ketoconazole -medicines to increase blood counts like filgrastim, pegfilgrastim, sargramostim -other chemotherapy drugs like cisplatin, doxorubicin, epirubicin, etoposide, teniposide, vincristine -quinidine -testosterone -vaccines -verapamil Talk to your doctor or health care professional before taking any of these medicines: -acetaminophen -aspirin -ibuprofen -ketoprofen -naproxen This list may not describe all possible interactions. Give your health care provider a list of all the medicines, herbs, non-prescription drugs, or dietary supplements you use. Also tell them if you smoke, drink alcohol, or use illegal drugs. Some items may interact with your medicine. What should I watch for while using this medicine? Your condition will be monitored carefully while you are receiving this medicine. You will need important blood work done while you are taking this medicine. This drug may make you feel generally unwell. This is not uncommon, as chemotherapy can affect healthy cells as well as cancer cells. Report any side effects. Continue your course of treatment even though you feel ill unless your doctor tells you to stop. In some cases, you may be given additional medicines to help with side effects. Follow all directions for their use. Call your doctor or health care professional for advice if you get a fever, chills or sore throat, or other symptoms of a cold or flu. Do not treat yourself. This drug decreases your body's ability to fight infections. Try to avoid being around people who are sick. This medicine may increase your risk to bruise or bleed. Call your doctor or health care professional if you notice any unusual bleeding. Be careful brushing and flossing your teeth or using a toothpick because you may get an infection or bleed more easily. If you have any dental work  done, tell your dentist you are receiving this  medicine. Avoid taking products that contain aspirin, acetaminophen, ibuprofen, naproxen, or ketoprofen unless instructed by your doctor. These medicines may hide a fever. Do not become pregnant while taking this medicine. Women should inform their doctor if they wish to become pregnant or think they might be pregnant. There is a potential for serious side effects to an unborn child. Talk to your health care professional or pharmacist for more information. Do not breast-feed an infant while taking this medicine. Men are advised not to father a child while receiving this medicine. What side effects may I notice from receiving this medicine? Side effects that you should report to your doctor or health care professional as soon as possible: -allergic reactions like skin rash, itching or hives, swelling of the face, lips, or tongue -low blood counts - This drug may decrease the number of white blood cells, red blood cells and platelets. You may be at increased risk for infections and bleeding. -signs of infection - fever or chills, cough, sore throat, pain or difficulty passing urine -signs of decreased platelets or bleeding - bruising, pinpoint red spots on the skin, black, tarry stools, nosebleeds -signs of decreased red blood cells - unusually weak or tired, fainting spells, lightheadedness -breathing problems -chest pain -high or low blood pressure -mouth sores -nausea and vomiting -pain, swelling, redness or irritation at the injection site -pain, tingling, numbness in the hands or feet -slow or irregular heartbeat -swelling of the ankle, feet, hands Side effects that usually do not require medical attention (report to your doctor or health care professional if they continue or are bothersome): -bone pain -complete hair loss including hair on your head, underarms, pubic hair, eyebrows, and eyelashes -changes in the color of fingernails -diarrhea -loosening of the fingernails -loss of  appetite -muscle or joint pain -red flush to skin -sweating This list may not describe all possible side effects. Call your doctor for medical advice about side effects. You may report side effects to FDA at 1-800-FDA-1088. Where should I keep my medicine? This drug is given in a hospital or clinic and will not be stored at home. NOTE: This sheet is a summary. It may not cover all possible information. If you have questions about this medicine, talk to your doctor, pharmacist, or health care provider.  2013, Elsevier/Gold Standard. (01/18/2008 11:54:26 AM)   Carboplatin injection What is this medicine? CARBOPLATIN (KAR boe pla tin) is a chemotherapy drug. It targets fast dividing cells, like cancer cells, and causes these cells to die. This medicine is used to treat ovarian cancer and many other cancers. This medicine may be used for other purposes; ask your health care provider or pharmacist if you have questions. What should I tell my health care provider before I take this medicine? They need to know if you have any of these conditions: -blood disorders -hearing problems -kidney disease -recent or ongoing radiation therapy -an unusual or allergic reaction to carboplatin, cisplatin, other chemotherapy, other medicines, foods, dyes, or preservatives -pregnant or trying to get pregnant -breast-feeding How should I use this medicine? This drug is usually given as an infusion into a vein. It is administered in a hospital or clinic by a specially trained health care professional. Talk to your pediatrician regarding the use of this medicine in children. Special care may be needed. Overdosage: If you think you have taken too much of this medicine contact a poison control center or emergency room at once. NOTE:  This medicine is only for you. Do not share this medicine with others. What if I miss a dose? It is important not to miss a dose. Call your doctor or health care professional if you  are unable to keep an appointment. What may interact with this medicine? -medicines for seizures -medicines to increase blood counts like filgrastim, pegfilgrastim, sargramostim -some antibiotics like amikacin, gentamicin, neomycin, streptomycin, tobramycin -vaccines Talk to your doctor or health care professional before taking any of these medicines: -acetaminophen -aspirin -ibuprofen -ketoprofen -naproxen This list may not describe all possible interactions. Give your health care provider a list of all the medicines, herbs, non-prescription drugs, or dietary supplements you use. Also tell them if you smoke, drink alcohol, or use illegal drugs. Some items may interact with your medicine. What should I watch for while using this medicine? Your condition will be monitored carefully while you are receiving this medicine. You will need important blood work done while you are taking this medicine. This drug may make you feel generally unwell. This is not uncommon, as chemotherapy can affect healthy cells as well as cancer cells. Report any side effects. Continue your course of treatment even though you feel ill unless your doctor tells you to stop. In some cases, you may be given additional medicines to help with side effects. Follow all directions for their use. Call your doctor or health care professional for advice if you get a fever, chills or sore throat, or other symptoms of a cold or flu. Do not treat yourself. This drug decreases your body's ability to fight infections. Try to avoid being around people who are sick. This medicine may increase your risk to bruise or bleed. Call your doctor or health care professional if you notice any unusual bleeding. Be careful brushing and flossing your teeth or using a toothpick because you may get an infection or bleed more easily. If you have any dental work done, tell your dentist you are receiving this medicine. Avoid taking products that contain  aspirin, acetaminophen, ibuprofen, naproxen, or ketoprofen unless instructed by your doctor. These medicines may hide a fever. Do not become pregnant while taking this medicine. Women should inform their doctor if they wish to become pregnant or think they might be pregnant. There is a potential for serious side effects to an unborn child. Talk to your health care professional or pharmacist for more information. Do not breast-feed an infant while taking this medicine. What side effects may I notice from receiving this medicine? Side effects that you should report to your doctor or health care professional as soon as possible: -allergic reactions like skin rash, itching or hives, swelling of the face, lips, or tongue -signs of infection - fever or chills, cough, sore throat, pain or difficulty passing urine -signs of decreased platelets or bleeding - bruising, pinpoint red spots on the skin, black, tarry stools, nosebleeds -signs of decreased red blood cells - unusually weak or tired, fainting spells, lightheadedness -breathing problems -changes in hearing -changes in vision -chest pain -high blood pressure -low blood counts - This drug may decrease the number of white blood cells, red blood cells and platelets. You may be at increased risk for infections and bleeding. -nausea and vomiting -pain, swelling, redness or irritation at the injection site -pain, tingling, numbness in the hands or feet -problems with balance, talking, walking -trouble passing urine or change in the amount of urine Side effects that usually do not require medical attention (report to your doctor or  health care professional if they continue or are bothersome): -hair loss -loss of appetite -metallic taste in the mouth or changes in taste This list may not describe all possible side effects. Call your doctor for medical advice about side effects. You may report side effects to FDA at 1-800-FDA-1088. Where should I keep  my medicine? This drug is given in a hospital or clinic and will not be stored at home. NOTE: This sheet is a summary. It may not cover all possible information. If you have questions about this medicine, talk to your doctor, pharmacist, or health care provider.  2012, Elsevier/Gold Standard. (05/12/2007 2:38:05 PM)

## 2012-08-05 ENCOUNTER — Telehealth: Payer: Self-pay | Admitting: *Deleted

## 2012-08-05 ENCOUNTER — Telehealth: Payer: Self-pay | Admitting: Emergency Medicine

## 2012-08-05 NOTE — Telephone Encounter (Signed)
Patient called stating that she took one dose of Effexor yesterday and states that she is "wired".  She states she feels as if she has to be "constantly moving" and feels that she has "drank 10 cups of coffee". She also complains of a "wicked headache".  Patient states that she is not going to continue taking the Effexor and will just take the xanax as needed for sleep and anxiety. Will advise patient of any further instructions per Dr Welton Flakes. Patient verbalized understanding.

## 2012-08-05 NOTE — Telephone Encounter (Signed)
Per staff message and POF I have scheduled appts.  JMW  

## 2012-08-06 ENCOUNTER — Telehealth: Payer: Self-pay | Admitting: *Deleted

## 2012-08-06 NOTE — Telephone Encounter (Signed)
sw pt gave appt d/t for 08/19/12 with labs@ 8:45am and ov @9 :15am..the patient is aware of her appts, and also aware that her tx will be on 08/20/12@8am ....td

## 2012-08-10 ENCOUNTER — Telehealth: Payer: Self-pay | Admitting: Medical Oncology

## 2012-08-10 NOTE — Telephone Encounter (Signed)
Patient LVMOM regarding instructions on how to take decadron for chemo treatment. Patient is scheduled for 1st treatment on 07/03 with MD appt 07/02. Per MD, notified patient that MD will give specific instructions on medication during pt's sched appt on 07/02. Patient to call office with any questions or concerns.

## 2012-08-12 ENCOUNTER — Telehealth: Payer: Self-pay | Admitting: Medical Oncology

## 2012-08-12 NOTE — Telephone Encounter (Signed)
Pt LVMOM requesting to know whether she needs to have lab draw that is scheduled 07/03 prior to her MD appt.   Most recent labs resulted 08/04/2012.  Will review with MD and inform pt.

## 2012-08-12 NOTE — Telephone Encounter (Signed)
Per MD, informed pt to keep lab appt for 07/03 prior to her MD appt. Patient confirmed appt. No furhter questions at this time.

## 2012-08-17 ENCOUNTER — Other Ambulatory Visit: Payer: Self-pay | Admitting: Student-PharmD

## 2012-08-19 ENCOUNTER — Encounter: Payer: Self-pay | Admitting: Oncology

## 2012-08-19 ENCOUNTER — Other Ambulatory Visit (HOSPITAL_BASED_OUTPATIENT_CLINIC_OR_DEPARTMENT_OTHER): Payer: BC Managed Care – PPO | Admitting: Lab

## 2012-08-19 ENCOUNTER — Telehealth: Payer: Self-pay | Admitting: *Deleted

## 2012-08-19 ENCOUNTER — Telehealth: Payer: Self-pay | Admitting: Oncology

## 2012-08-19 ENCOUNTER — Ambulatory Visit (HOSPITAL_BASED_OUTPATIENT_CLINIC_OR_DEPARTMENT_OTHER): Payer: BC Managed Care – PPO | Admitting: Oncology

## 2012-08-19 VITALS — BP 164/97 | HR 103 | Temp 98.0°F | Resp 20 | Ht 70.0 in | Wt 169.9 lb

## 2012-08-19 DIAGNOSIS — C569 Malignant neoplasm of unspecified ovary: Secondary | ICD-10-CM

## 2012-08-19 LAB — COMPREHENSIVE METABOLIC PANEL (CC13)
ALT: 16 U/L (ref 0–55)
AST: 12 U/L (ref 5–34)
Albumin: 3.8 g/dL (ref 3.5–5.0)
Alkaline Phosphatase: 65 U/L (ref 40–150)
BUN: 12.3 mg/dL (ref 7.0–26.0)
CO2: 28 mEq/L (ref 22–29)
Calcium: 9.8 mg/dL (ref 8.4–10.4)
Chloride: 105 mEq/L (ref 98–109)
Creatinine: 0.8 mg/dL (ref 0.6–1.1)
Glucose: 112 mg/dl (ref 70–140)
Potassium: 3.8 mEq/L (ref 3.5–5.1)
Sodium: 143 mEq/L (ref 136–145)
Total Bilirubin: 0.32 mg/dL (ref 0.20–1.20)
Total Protein: 7.1 g/dL (ref 6.4–8.3)

## 2012-08-19 LAB — CBC WITH DIFFERENTIAL/PLATELET
BASO%: 0.9 % (ref 0.0–2.0)
Basophils Absolute: 0 10*3/uL (ref 0.0–0.1)
EOS%: 0 % (ref 0.0–7.0)
Eosinophils Absolute: 0 10*3/uL (ref 0.0–0.5)
HCT: 36.9 % (ref 34.8–46.6)
HGB: 12.8 g/dL (ref 11.6–15.9)
LYMPH%: 48.1 % (ref 14.0–49.7)
MCH: 31.1 pg (ref 25.1–34.0)
MCHC: 34.6 g/dL (ref 31.5–36.0)
MCV: 89.7 fL (ref 79.5–101.0)
MONO#: 0.3 10*3/uL (ref 0.1–0.9)
MONO%: 7.1 % (ref 0.0–14.0)
NEUT#: 1.8 10*3/uL (ref 1.5–6.5)
NEUT%: 43.9 % (ref 38.4–76.8)
Platelets: 182 10*3/uL (ref 145–400)
RBC: 4.12 10*6/uL (ref 3.70–5.45)
RDW: 14.5 % (ref 11.2–14.5)
WBC: 4 10*3/uL (ref 3.9–10.3)
lymph#: 1.9 10*3/uL (ref 0.9–3.3)

## 2012-08-19 NOTE — Patient Instructions (Addendum)
#  1 we discussed your chemotherapy treatment today. Your blood counts look terrific. You will proceed with cycle #2 of Taxol and carboplatinum.  #2 we discussed you going ahead and starting taking gabapentin 100 mg 3 times a day. You are also on a super B complex.  #3 we discussed not using Neulasta with this cycle. However should you develop severe neutropenia and you do not get a good breast response of recovery of your bone marrow then we will 2 Neulasta which her third cycle of chemotherapy.  #4 I will see you back in one week's time in followup. Her

## 2012-08-19 NOTE — Telephone Encounter (Signed)
Per staff message and POF I have scheduled appts.  JMW  

## 2012-08-20 ENCOUNTER — Other Ambulatory Visit: Payer: BC Managed Care – PPO | Admitting: Lab

## 2012-08-20 ENCOUNTER — Ambulatory Visit (HOSPITAL_BASED_OUTPATIENT_CLINIC_OR_DEPARTMENT_OTHER): Payer: BC Managed Care – PPO

## 2012-08-20 ENCOUNTER — Ambulatory Visit: Payer: BC Managed Care – PPO | Admitting: Gynecologic Oncology

## 2012-08-20 ENCOUNTER — Ambulatory Visit: Payer: BC Managed Care – PPO | Admitting: Oncology

## 2012-08-20 VITALS — BP 138/92 | HR 105 | Temp 98.2°F | Resp 20

## 2012-08-20 DIAGNOSIS — C569 Malignant neoplasm of unspecified ovary: Secondary | ICD-10-CM

## 2012-08-20 DIAGNOSIS — C562 Malignant neoplasm of left ovary: Secondary | ICD-10-CM

## 2012-08-20 DIAGNOSIS — Z5111 Encounter for antineoplastic chemotherapy: Secondary | ICD-10-CM

## 2012-08-20 MED ORDER — ONDANSETRON 16 MG/50ML IVPB (CHCC)
16.0000 mg | Freq: Once | INTRAVENOUS | Status: AC
  Administered 2012-08-20: 16 mg via INTRAVENOUS

## 2012-08-20 MED ORDER — SODIUM CHLORIDE 0.9 % IV SOLN
Freq: Once | INTRAVENOUS | Status: AC
  Administered 2012-08-20: 09:00:00 via INTRAVENOUS

## 2012-08-20 MED ORDER — SODIUM CHLORIDE 0.9 % IV SOLN
632.5000 mg | Freq: Once | INTRAVENOUS | Status: AC
  Administered 2012-08-20: 630 mg via INTRAVENOUS
  Filled 2012-08-20: qty 63

## 2012-08-20 MED ORDER — SODIUM CHLORIDE 0.9 % IJ SOLN
10.0000 mL | INTRAMUSCULAR | Status: DC | PRN
  Administered 2012-08-20: 10 mL
  Filled 2012-08-20: qty 10

## 2012-08-20 MED ORDER — HEPARIN SOD (PORK) LOCK FLUSH 100 UNIT/ML IV SOLN
500.0000 [IU] | Freq: Once | INTRAVENOUS | Status: AC | PRN
  Administered 2012-08-20: 500 [IU]
  Filled 2012-08-20: qty 5

## 2012-08-20 MED ORDER — PACLITAXEL CHEMO INJECTION 300 MG/50ML
175.0000 mg/m2 | Freq: Once | INTRAVENOUS | Status: AC
  Administered 2012-08-20: 336 mg via INTRAVENOUS
  Filled 2012-08-20: qty 56

## 2012-08-20 MED ORDER — DIPHENHYDRAMINE HCL 50 MG/ML IJ SOLN
50.0000 mg | Freq: Once | INTRAMUSCULAR | Status: AC
  Administered 2012-08-20: 50 mg via INTRAVENOUS

## 2012-08-20 MED ORDER — FAMOTIDINE IN NACL 20-0.9 MG/50ML-% IV SOLN
20.0000 mg | Freq: Once | INTRAVENOUS | Status: AC
  Administered 2012-08-20: 20 mg via INTRAVENOUS

## 2012-08-20 MED ORDER — DEXAMETHASONE SODIUM PHOSPHATE 20 MG/5ML IJ SOLN
20.0000 mg | Freq: Once | INTRAMUSCULAR | Status: AC
  Administered 2012-08-20: 20 mg via INTRAVENOUS

## 2012-08-20 NOTE — Patient Instructions (Addendum)
[facility] Discharge Instructions for Patients Receiving Chemotherapy  Today you received the following chemotherapy agents: Taxol and Carboplatin.  To help prevent nausea and vomiting after your treatment, we encourage you to take your nausea medication as prescribed.   If you develop nausea and vomiting that is not controlled by your nausea medication, call the clinic.   BELOW ARE SYMPTOMS THAT SHOULD BE REPORTED IMMEDIATELY:  *FEVER GREATER THAN 100.5 F  *CHILLS WITH OR WITHOUT FEVER  NAUSEA AND VOMITING THAT IS NOT CONTROLLED WITH YOUR NAUSEA MEDICATION  *UNUSUAL SHORTNESS OF BREATH  *UNUSUAL BRUISING OR BLEEDING  TENDERNESS IN MOUTH AND THROAT WITH OR WITHOUT PRESENCE OF ULCERS  *URINARY PROBLEMS  *BOWEL PROBLEMS  UNUSUAL RASH Items with * indicate a potential emergency and should be followed up as soon as possible.  Feel free to call the clinic you have any questions or concerns. The clinic phone number is [phone].    

## 2012-08-20 NOTE — Progress Notes (Signed)
Patient tolerated first treatment of Taxol and Carbo. No reaction.

## 2012-08-24 ENCOUNTER — Telehealth: Payer: Self-pay | Admitting: *Deleted

## 2012-08-24 NOTE — Telephone Encounter (Signed)
Message copied by Augusto Garbe on Mon Aug 24, 2012  4:29 PM ------      Message from: Lenn Sink I      Created: Thu Aug 20, 2012  4:18 PM      Regarding: follow up call       First time Taxol and Carboplatin. No reaction. Dr. Welton Flakes.  ------

## 2012-08-24 NOTE — Telephone Encounter (Signed)
Called Morgan Olsen at the [phone] number.  Messge left requesting a return call for chemotherapy follow up.  Awaiting return call from patient.

## 2012-08-26 ENCOUNTER — Ambulatory Visit: Payer: BC Managed Care – PPO | Admitting: Oncology

## 2012-08-26 ENCOUNTER — Telehealth: Payer: Self-pay | Admitting: Medical Oncology

## 2012-08-26 NOTE — Telephone Encounter (Signed)
Pt called stating she needs prior approval from her insurance regarding zofran prescription, states they will cover only every 30 days, this won't cover the months she has treatment scheduled twice . Informed patient will forward her message to Thelma Barge in managed care.    Patient also inquiring as to why she was not receiving Aloxi during her treatments, currently receiving Zofran IV. States she was informed by MD that Aloxi was going to be part of her treatment. Informed pt will review this with MD and call her back. Mssg forwarded to MD.  Next sched appt 07/11 lab/MD

## 2012-08-28 ENCOUNTER — Ambulatory Visit (HOSPITAL_BASED_OUTPATIENT_CLINIC_OR_DEPARTMENT_OTHER): Payer: BC Managed Care – PPO | Admitting: Oncology

## 2012-08-28 ENCOUNTER — Other Ambulatory Visit (HOSPITAL_BASED_OUTPATIENT_CLINIC_OR_DEPARTMENT_OTHER): Payer: BC Managed Care – PPO | Admitting: Lab

## 2012-08-28 ENCOUNTER — Encounter: Payer: Self-pay | Admitting: Oncology

## 2012-08-28 VITALS — BP 147/94 | HR 97 | Temp 97.8°F | Resp 20 | Ht 70.0 in | Wt 168.0 lb

## 2012-08-28 DIAGNOSIS — F411 Generalized anxiety disorder: Secondary | ICD-10-CM

## 2012-08-28 DIAGNOSIS — C569 Malignant neoplasm of unspecified ovary: Secondary | ICD-10-CM

## 2012-08-28 LAB — CBC WITH DIFFERENTIAL/PLATELET
BASO%: 0.8 % (ref 0.0–2.0)
Basophils Absolute: 0 10*3/uL (ref 0.0–0.1)
EOS%: 0 % (ref 0.0–7.0)
Eosinophils Absolute: 0 10*3/uL (ref 0.0–0.5)
HCT: 35.3 % (ref 34.8–46.6)
HGB: 12 g/dL (ref 11.6–15.9)
LYMPH%: 54.8 % — ABNORMAL HIGH (ref 14.0–49.7)
MCH: 30.3 pg (ref 25.1–34.0)
MCHC: 34 g/dL (ref 31.5–36.0)
MCV: 89.1 fL (ref 79.5–101.0)
MONO#: 0.1 10*3/uL (ref 0.1–0.9)
MONO%: 2.8 % (ref 0.0–14.0)
NEUT#: 1.6 10*3/uL (ref 1.5–6.5)
NEUT%: 41.6 % (ref 38.4–76.8)
Platelets: 113 10*3/uL — ABNORMAL LOW (ref 145–400)
RBC: 3.96 10*6/uL (ref 3.70–5.45)
RDW: 14.1 % (ref 11.2–14.5)
WBC: 3.8 10*3/uL — ABNORMAL LOW (ref 3.9–10.3)
lymph#: 2.1 10*3/uL (ref 0.9–3.3)

## 2012-08-28 LAB — COMPREHENSIVE METABOLIC PANEL (CC13)
ALT: 17 U/L (ref 0–55)
AST: 13 U/L (ref 5–34)
Albumin: 3.8 g/dL (ref 3.5–5.0)
Alkaline Phosphatase: 61 U/L (ref 40–150)
BUN: 15.7 mg/dL (ref 7.0–26.0)
CO2: 28 mEq/L (ref 22–29)
Calcium: 10.1 mg/dL (ref 8.4–10.4)
Chloride: 103 mEq/L (ref 98–109)
Creatinine: 0.7 mg/dL (ref 0.6–1.1)
Glucose: 96 mg/dl (ref 70–140)
Potassium: 3.6 mEq/L (ref 3.5–5.1)
Sodium: 138 mEq/L (ref 136–145)
Total Bilirubin: 0.23 mg/dL (ref 0.20–1.20)
Total Protein: 6.9 g/dL (ref 6.4–8.3)

## 2012-08-28 NOTE — Progress Notes (Deleted)
OFFICE PROGRESS NOTE  CC**  FRIED, [name], MD [address] [place] [address]  DIAGNOSIS: ***  PRIOR THERAPY:***  CURRENT THERAPY:***  INTERVAL HISTORY: Morgan Olsen 52 y.o. female returns for***  MEDICAL HISTORY: Past Medical History  Diagnosis Date  . Hypertension   . Asthma   . Cancer   . Hypertension 08/04/2012  . Exercise-induced asthma 08/04/2012    ALLERGIES:  is allergic to sulfa antibiotics.  MEDICATIONS:  Current Outpatient Prescriptions  Medication Sig Dispense Refill  . ALPRAZolam (XANAX) 0.25 MG tablet Take 1 tablet (0.25 mg total) by mouth at bedtime as needed for sleep.  30 tablet  3  . amLODipine-benazepril (LOTREL) 5-40 MG per capsule Take 1 capsule by mouth daily.      Marland Kitchen dexamethasone (DECADRON) 4 MG tablet Take 2 tablets (8 mg total) by mouth 2 (two) times daily with a meal. Take two times a day starting the day after chemotherapy for 3 days.  30 tablet  1  . DiphenhydrAMINE HCl (BENADRYL PO) Take by mouth.      Tery Sanfilippo Sodium (COLACE PO) Take by mouth.      . gabapentin (NEURONTIN) 100 MG capsule Take 1 capsule (100 mg total) by mouth 3 (three) times daily.  90 capsule  6  . lidocaine-prilocaine (EMLA) cream Apply topically as needed.  30 g  6  . lidocaine-prilocaine (EMLA) cream Apply topically as needed.  30 g  0  . montelukast (SINGULAIR) 10 MG tablet Take 10 mg by mouth at bedtime.      . ondansetron (ZOFRAN ODT) 4 MG disintegrating tablet Take 1 tablet (4 mg total) by mouth every 8 (eight) hours as needed for nausea.  10 tablet  0  . ondansetron (ZOFRAN) 8 MG tablet Take 1 tablet (8 mg total) by mouth 2 (two) times daily. Take two times a day starting the day after chemo for 3 days. Then take two times a day as needed for nausea or vomiting.  30 tablet  1  . oxycodone (OXY-IR) 5 MG capsule Take 1 capsule (5 mg total) by mouth every 6 (six) hours as needed.  90 capsule  0  . pyridOXINE (VITAMIN B-6) 100 MG tablet Take 200 mg by mouth daily.      Marland Kitchen  HYDROcodone-acetaminophen (NORCO/VICODIN) 5-325 MG per tablet Take 2 tablets by mouth every 4 (four) hours as needed.  16 tablet  0  . ibuprofen (ADVIL,MOTRIN) 800 MG tablet Take 800 mg by mouth every 8 (eight) hours as needed for pain.      Marland Kitchen LORazepam (ATIVAN) 0.5 MG tablet Take 1 tablet (0.5 mg total) by mouth every 6 (six) hours as needed (Nausea or vomiting).  30 tablet  0  . LORazepam (ATIVAN) 1 MG tablet Take 1 tablet (1 mg total) by mouth 3 (three) times daily as needed for anxiety.  15 tablet  0  . prochlorperazine (COMPAZINE) 10 MG tablet Take 1 tablet (10 mg total) by mouth every 6 (six) hours as needed (Nausea or vomiting).  30 tablet  1  . prochlorperazine (COMPAZINE) 25 MG suppository Place 1 suppository (25 mg total) rectally every 12 (twelve) hours as needed for nausea.  12 suppository  3  . Prochlorperazine Maleate (COMPAZINE PO) Take by mouth.      . Promethazine HCl (PHENERGAN PO) Take by mouth.       No current facility-administered medications for this visit.    SURGICAL HISTORY:  Past Surgical History  Procedure Laterality Date  .  Cesarean section    . Tonsillectomy    . Ablation      REVIEW OF SYSTEMS:  {Ros - complete:30496}   HEALTH MAINTENANCE:  Mammogram*** Colonoscopy*** Bone  Scan*** Pap Smear*** Eye Exam*** Vitamin D*** Lipid Panel***  PHYSICAL EXAMINATION: Blood pressure 147/94, pulse 97, temperature 97.8 F (36.6 C), temperature source Oral, resp. rate 20, height 5\' 10"  (1.778 m), weight 168 lb (76.204 kg). Body mass index is 24.11 kg/(m^2). ECOG PERFORMANCE STATUS: {CHL ONC ECOG [id number]   {physical exam:21449}   LABORATORY DATA: Lab Results  Component Value Date   WBC 3.8* 08/28/2012   HGB 12.0 08/28/2012   HCT 35.3 08/28/2012   MCV 89.1 08/28/2012   PLT 113* 08/28/2012      Chemistry      Component Value Date/Time   NA 138 08/28/2012 1116   NA 138 07/08/2012 1655   K 3.6 08/28/2012 1116   K 3.8 07/08/2012 1655   CL 99 08/04/2012  1135   CL 100 07/08/2012 1655   CO2 28 08/28/2012 1116   CO2 26 07/08/2012 1655   BUN 15.7 08/28/2012 1116   BUN 13 07/08/2012 1655   CREATININE 0.7 08/28/2012 1116   CREATININE 0.70 07/08/2012 1655      Component Value Date/Time   CALCIUM 10.1 08/28/2012 1116   CALCIUM 9.7 07/08/2012 1655   ALKPHOS 61 08/28/2012 1116   ALKPHOS 73 07/08/2012 1655   AST 13 08/28/2012 1116   AST 15 07/08/2012 1655   ALT 17 08/28/2012 1116   ALT 16 07/08/2012 1655   BILITOT 0.23 08/28/2012 1116   BILITOT 0.3 07/08/2012 1655       RADIOGRAPHIC STUDIES:  No results found.  ASSESSMENT: ***   PLAN: ***   All questions were answered. The patient knows to call the clinic with any problems, questions or concerns. We can certainly see the patient much sooner if necessary.  I spent {CHL ONC TIME VISIT - ZOXWR:[id number]} counseling the patient face to face. The total time spent in the appointment was {CHL ONC TIME VISIT - BJYNW:[id number]}.    Drue Second, MD Medical/Oncology [facility] [phone] (beeper) [phone] (Office)  08/28/2012, 12:43 PM

## 2012-08-28 NOTE — Patient Instructions (Addendum)
Doing well clinically  We will see you back on 09/10/12

## 2012-08-30 NOTE — Progress Notes (Signed)
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 She states that she first noticed this pain briefly in 2012 but it was self-limited. She never saw any care. She however continued to have occasional episodes of debilitating pelvic pain that will go if after a few minutes. However in May she had an episode of this pain was acute onset severe lower abdominal/pelvic pain accompanied by nausea and cold sweats. It did not resolve spontaneously showed she presented to the ER for further evaluation. She had a CT scan performed in the emergency department that showed an 11 cm complex left ovarian mass with mild mesenteric/omental stranding, small ascites and peritoneal nodularity. Because of this she was referred to Dr. [name] who performed a transvaginal ultrasound confirming a 11.5 cm complex left ovarian mass no free fluid was noted and the uterus was normal. She had a CEA 125 checked and it was elevated at 1600.  #2She subsequently was seen by Dr. [name] and she underwent on 07/15/2012 total [name] hysterectomy bilateral salpingo-oophorectomy ovarian cancer staging and appendectomy. Intraoperatively patient was found to have a 10 cm left ovary with a cyst containing or blood. The ovarian cyst was hard he ruptured upon entry to the abdomen. The frozen section revealed an adenocarcinoma of the ovary. The left ovarian mass was adherent to the uterine fundus and the sigmoid colon mesentery. The left ureter was dilated to 1 cm prior to the mass removal. The pathology showed a carcinosarcoma, [name] is type, consisting of mixed endometrioid, clear cell, mucinous, and squamous cell carcinoma components and [name] sarcomatous component. Tumor necrosis and acute hemorrhage is present tumor size was 10.5 x  8.0 x 8.5 cm no ovarian surface involvement was identified no lymphovascular invasion identified. Left fallopian tube the serosal fibrovascular decisions were noted mild chronic salpingitis no tumor was seen. Tumor did involve left ovary and periureteral nodule only. Her surgery was performed at [facility] She was seen by oncology there and patient was started on carboplatinum plus paclitaxel. Overall she tolerated it well  #3 patient is now to commence undergoing adjuvant chemotherapy consisting of Taxol and carboplatinum.this is given every 21 days. First cycle was given in [place].  CURRENT THERAPY:cycle 2 of Taxol carboplatinum to be administered on 08/20/2012  INTERVAL HISTORY: Morgan Olsen 52 y.o. female returns for followup visit prior to her scheduled chemotherapy. Clinically she seems to be doing well. She has a lot of complaints but she is excited to get started on chemotherapy. She is very anxious nervous as usual. She has no fevers chills night sweats headaches shortness of breath chest pains palpitations no nausea or vomiting. We discussed how to use her anti-emetics.She has no bleeding problems. Remainder of the 10 point review of systems is negative  MEDICAL HISTORY: Past Medical History  Diagnosis Date  . Hypertension   . Asthma   . Cancer   . Hypertension 08/04/2012  . Exercise-induced asthma 08/04/2012    ALLERGIES:  is allergic to sulfa antibiotics.  MEDICATIONS:  Current Outpatient Prescriptions  Medication Sig Dispense Refill  . ALPRAZolam (XANAX) 0.25 MG tablet Take 1 tablet (0.25 mg total) by mouth at bedtime as needed for sleep.  30 tablet  3  . amLODipine-benazepril (LOTREL) 5-40 MG per capsule Take 1  capsule by mouth daily.      . DiphenhydrAMINE HCl (BENADRYL PO) Take by mouth.      . lidocaine-prilocaine (EMLA) cream Apply topically as needed.  30 g  6  . lidocaine-prilocaine (EMLA) cream Apply topically as needed.  30 g  0  . montelukast (SINGULAIR) 10 MG  tablet Take 10 mg by mouth at bedtime.      . ondansetron (ZOFRAN ODT) 4 MG disintegrating tablet Take 1 tablet (4 mg total) by mouth every 8 (eight) hours as needed for nausea.  10 tablet  0  . ondansetron (ZOFRAN) 8 MG tablet Take 1 tablet (8 mg total) by mouth 2 (two) times daily. Take two times a day starting the day after chemo for 3 days. Then take two times a day as needed for nausea or vomiting.  30 tablet  1  . pyridOXINE (VITAMIN B-6) 100 MG tablet Take 200 mg by mouth daily.      Marland Kitchen dexamethasone (DECADRON) 4 MG tablet Take 2 tablets (8 mg total) by mouth 2 (two) times daily with a meal. Take two times a day starting the day after chemotherapy for 3 days.  30 tablet  1  . Docusate Sodium (COLACE PO) Take by mouth.      . gabapentin (NEURONTIN) 100 MG capsule Take 1 capsule (100 mg total) by mouth 3 (three) times daily.  90 capsule  6  . HYDROcodone-acetaminophen (NORCO/VICODIN) 5-325 MG per tablet Take 2 tablets by mouth every 4 (four) hours as needed.  16 tablet  0  . ibuprofen (ADVIL,MOTRIN) 800 MG tablet Take 800 mg by mouth every 8 (eight) hours as needed for pain.      Marland Kitchen LORazepam (ATIVAN) 0.5 MG tablet Take 1 tablet (0.5 mg total) by mouth every 6 (six) hours as needed (Nausea or vomiting).  30 tablet  0  . LORazepam (ATIVAN) 1 MG tablet Take 1 tablet (1 mg total) by mouth 3 (three) times daily as needed for anxiety.  15 tablet  0  . oxycodone (OXY-IR) 5 MG capsule Take 1 capsule (5 mg total) by mouth every 6 (six) hours as needed.  90 capsule  0  . prochlorperazine (COMPAZINE) 10 MG tablet Take 1 tablet (10 mg total) by mouth every 6 (six) hours as needed (Nausea or vomiting).  30 tablet  1  . prochlorperazine (COMPAZINE) 25 MG suppository Place 1 suppository (25 mg total) rectally every 12 (twelve) hours as needed for nausea.  12 suppository  3  . Prochlorperazine Maleate (COMPAZINE PO) Take by mouth.      . Promethazine HCl (PHENERGAN PO) Take by mouth.       No current  facility-administered medications for this visit.    SURGICAL HISTORY:  Past Surgical History  Procedure Laterality Date  . Cesarean section    . Tonsillectomy    . Ablation      REVIEW OF SYSTEMS:  Pertinent items are noted in HPI.   HEALTH MAINTENANCE:  PHYSICAL EXAMINATION: Blood pressure 164/97, pulse 103, temperature 98 F (36.7 C), temperature source Oral, resp. rate 20, height 5\' 10"  (1.778 m), weight 169 lb 14.4 oz (77.066 kg). Body mass index is 24.38 kg/(m^2). ECOG PERFORMANCE STATUS: 0 - Asymptomatic   General appearance: alert, cooperative and appears stated age Resp: clear to auscultation bilaterally Cardio: regular rate and rhythm GI: soft, non-tender; bowel sounds normal; no masses,  no organomegaly Extremities: extremities normal, atraumatic, no cyanosis or edema Neurologic: Grossly normal   LABORATORY DATA:  Lab Results  Component Value Date   WBC 3.8* 08/28/2012   HGB 12.0 08/28/2012   HCT 35.3 08/28/2012   MCV 89.1 08/28/2012   PLT 113* 08/28/2012      Chemistry      Component Value Date/Time   NA 138 08/28/2012 1116   NA 138 07/08/2012 1655   K 3.6 08/28/2012 1116   K 3.8 07/08/2012 1655   CL 99 08/04/2012 1135   CL 100 07/08/2012 1655   CO2 28 08/28/2012 1116   CO2 26 07/08/2012 1655   BUN 15.7 08/28/2012 1116   BUN 13 07/08/2012 1655   CREATININE 0.7 08/28/2012 1116   CREATININE 0.70 07/08/2012 1655      Component Value Date/Time   CALCIUM 10.1 08/28/2012 1116   CALCIUM 9.7 07/08/2012 1655   ALKPHOS 61 08/28/2012 1116   ALKPHOS 73 07/08/2012 1655   AST 13 08/28/2012 1116   AST 15 07/08/2012 1655   ALT 17 08/28/2012 1116   ALT 16 07/08/2012 1655   BILITOT 0.23 08/28/2012 1116   BILITOT 0.3 07/08/2012 1655       RADIOGRAPHIC STUDIES:  No results found.  ASSESSMENT: 52 year old female with   #1stage IIB carcinosarcoma of the ovaries status post surgery on 07/15/2012 consisting of total abdominal hysterectomy bilateral salpingo-oophorectomy for ovarian  cancer staging and appendectomy. This was done at [facility] by Dr. Laurette Schimke.  #2 adjuvant chemotherapy started at [facility] she received her first cycle day or consisting of Taxol and carboplatinum. She will now receive cycle #2 on 08/20/2012 of Taxol and carboplatinum every 21 days for total of 6 cycles. Patient understands risks benefits and side effects of chemotherapy in clear terms. She does have a Port-A-Cath placed.   PLAN:   #1 proceed with cycle 2 of Taxol carboplatinum.  #2 she will return in one week's time for interim lab.   All questions were answered. The patient knows to call the clinic with any problems, questions or concerns. We can certainly see the patient much sooner if necessary.  I spent 25 minutes counseling the patient face to face. The total time spent in the appointment was 30 minutes.    Drue Second, MD Medical/Oncology [facility] [phone] (beeper) [phone] (Office)

## 2012-09-03 ENCOUNTER — Other Ambulatory Visit: Payer: Self-pay | Admitting: Emergency Medicine

## 2012-09-03 ENCOUNTER — Ambulatory Visit: Payer: BC Managed Care – PPO | Attending: Gynecologic Oncology | Admitting: Gynecologic Oncology

## 2012-09-03 ENCOUNTER — Encounter: Payer: Self-pay | Admitting: Gynecologic Oncology

## 2012-09-03 VITALS — BP 142/84 | HR 62 | Temp 97.9°F | Resp 16 | Ht 69.84 in | Wt 169.4 lb

## 2012-09-03 DIAGNOSIS — Z9079 Acquired absence of other genital organ(s): Secondary | ICD-10-CM | POA: Insufficient documentation

## 2012-09-03 DIAGNOSIS — C569 Malignant neoplasm of unspecified ovary: Secondary | ICD-10-CM | POA: Insufficient documentation

## 2012-09-03 DIAGNOSIS — Z90710 Acquired absence of both cervix and uterus: Secondary | ICD-10-CM | POA: Insufficient documentation

## 2012-09-03 DIAGNOSIS — I10 Essential (primary) hypertension: Secondary | ICD-10-CM | POA: Insufficient documentation

## 2012-09-03 NOTE — Progress Notes (Signed)
GYNECOLOGIC ONCOLOGY TREATMENT PLANNING VISIT  CHIEF COMPLAINT: Ovarian cancer surveillance    ASSESSMENT/PLAN:  52 y.o. with Stage IIB carcinosarcoma of the ovary. Has received 2/6 carboplatin plus paclitaxel. Followup with Dr.Khan as scheduled Followup with GYN oncology in 2 months   History of Present Illness:  Morgan Olsen is a 52 y.o.  who initially presented to the ER with pelvic pain. She states she first noticed this pain briefly in 2012, but it was self limited, so she never sought care for it. Since then, she has had occasional episodes of debilitating pelvic pain that usually go away after a few minutes. However, during her most recent episode, she developed acute onset severe lower abdominal/pelvic pain accompanied by cold sweats and nausea. The pain didn't resolve spontaneously so she ultimately went to the ER for evaluation. A CT scan in the ED showed an 11cm complex left ovarian mass with mild mesenteric/omental stranding, small ascites, and peritoneal nodularity. She was referred to Dr. [name], who performed a TVUS confirming an 11.5 cm complex left ovarian mass. No free fluid was seen and the uterus was otherwise normal. Her CA-125 was checked and elevated at 1600. Patient denies having any back pain or any changes in her bowel or bladder habits.   Oncology Summary:    Ovarian cancer    07/15/2012  Initial Diagnosis  Ovarian cancer    07/15/2012  Surgery  TAH, BSO, ovarian cancer staging, appendectomy   Intraoperative Findings:  10 cm Left ovary with cyst containing old blood and excrescences. The ovarian cyst was already ruptured upon entry to the abdomen. Adenocarcinoma of the ovary on frozen section: cannot rule out non-gyn primary. Left ovarian mass adherent to the uterine fundus and the sigmoid colon mesentery. Left ureter dilated to 1 cm prior to mass removal. Friable, dark soft tissue implants throughout the posterior cul-de-sac and adherent to the appendix. Bilateral pelvic  adenopathy, largest 1 cm. Normal-appearing right tube and ovary. No masses noted on the small or large bowel, the mesentery, the liver edge, or the diaphragm. At the end of the case there was no residual disease.  Pathology:  Ovary, left, oophorectomy - Carcinosarcoma, homologous type, consisting of mixed endometrioid, clear cell, mucinous, and squamous cell carcinoma components and homologous sarcomatous component.  - No heterologous elements identified  - Tumor necrosis and acute hemorrhage present  - Tumor size 10.5 x 8 x 8.5 cm  - No ovarian surface involvement identified  - No lymphovascular invasion identified  Left fallopian tube - Serosal fibrovascular adhesions, mild chronic salpingitis, no tumor seen Extent of involvement of other organs tissues: Tumor involves left ovary and "periureteral nodule" only  Sections of the left ovary show a carcinosarcoma. Immunostains are performed on representative blocks of the left ovarian tumor, E17 and E18, consisting of the carcinomatous and sarcomatous components, respectively. The carcinoma component stains positive for cytokeratin 7, CA-125, PAX8, ER, and PR, and shows subset positivity for p63, and is negative for cytokeratin 20, and cdx2, supporting a tumor primary gynecologic origin.  Immunostains are performed on a representative block of the ovarian tumor showing the sarcomatous component (A18). Desmin and smooth muscle actin show subset positive staining within the sarcomatous component. Vimentin stains both components strongly and diffusely positive. Inhibin is negative. Calretinin shows focal subset positive staining. Pancytokeratin (AE1/AE3) stains the epithelial component strongly positive and is negative within the sarcomatous component. These findings support the presence of a homologous sarcomatous component and the diagnosis of carcinosarcoma which is retained.  Ms.Rill has since received 2 cycles of Taxol carboplatin. The first cycle was  administered at [facility] and a subsequent cycles will be directed by Dr. Welton Flakes.   Past Medical History:  Past Medical History   Diagnosis  Date   .  Hypertension    .  Endometriosis      Identified at the time of cesarean section   .  Asthma    .  Melanoma in situ      s/p excision   .  Anxiety    .  Cancer, uterine    .  H/O bilateral salpingo-oophorectomy    .  Ovarian cancer  07/22/2012   Past Surgical History:  Past Surgical History   Procedure  Laterality  Date   .  Wisdom tooth extraction   1980s   .  Tonsillectomy and adenoidectomy   1980s   .  Cesarean section   1998   .  Endometrial ablation   2011     For menorrhagia   .  Pr resec ovar malig+tah+pelv nodes  N/A  07/15/2012     Procedure: RESECTION(INITIAL) OVARIAN/TUBAL/PRIMA PERTONL MALIG W/BIL S&O, OMNTECT; W/TAH/LTD PARA-AORTIC LYMPHADENECT; Surgeon: Doreatha Lew, MD; Location: MAIN OR [facility]; Service: Gynecology Oncology   .  Pr appendectomy  N/A  07/15/2012     Procedure: APPENDECTOMY; Surgeon: Doreatha Lew, MD; Location: MAIN OR [facility]; Service: Gynecology Oncology   .  Abdominal hysterectomy     .  Abdominal surgery     .  Pr colonoscopy,diagnostic  N/A  07/17/2012     Procedure: COLONOSCOPY, FLEXIBLE, PROXIMAL TO SPLENIC FLEXURE; DIAGNOSTIC, W/WO COLLECTION SPECIMEN BY BRUSH OR WASH; Surgeon: Briscoe Burns, MD; Location: [facility]; Service: Gastroenterology   .  Pr upper gi endoscopy,biopsy  Left  07/17/2012     Procedure: UGI ENDOSCOPY; WITH BIOPSY, SINGLE OR MULTIPLE; Surgeon: Briscoe Burns, MD; Location: [facility]; Service: Gastroenterology   Family History:  Family History   Problem  Relation  Age of Onset   .  Cancer  Other  80     Colon   .  Cancer  Father  21     Colon   .  Cancer  Mother      Thyroid   .  Cancer  Other  70     Ovarian   .  Stroke  Paternal Grandmother    .  Asthma  Mother     Social History:  Her daughter is spending a few weeks at the  grandparents.  REVIEW OF SYSTEMS Constitutional  Feels well  Cardiovascular  No chest pain, shortness of breath, or edema  Pulmonary  No cough or wheeze.  Gastro Intestinal  No nausea, vomitting, or diarrhoea. No bright red blood per rectum, no abdominal pain, change in bowel movement, or constipation.  Genito Urinary  No frequency, urgency, dysuria,  Musculo Skeletal  No myalgia, arthralgia, joint swelling or pain  Neurologic  No weakness, numbness, change in gait,  Psychology  No depression, anxiety, insomnia.    PHYSICAL EXAMINATION  VITAL SIGNS BP 142/84  Pulse 62  Temp(Src) 97.9 F (36.6 C) (Oral)  Resp 16  Ht 5' 9.84" (1.774 m)  Wt 169 lb 6.4 oz (76.839 kg)  BMI 24.42 kg/m2 Cardiovascular  Psychiatry  Alert and oriented to person, place, and time, in very good spirits Abdomen  Normoactive bowel sounds, abdomen soft, non-tender and obese. Surgical sites intact without evidence of hernia.  Back  No CVA tenderness  Pelvic: Normal surgeon to Bartholin's urethra and Skene's. Atrophic vagina no pelvic masses no cul-de-sac nodularity Rectal: Good tone no masses Extremities  No bilateral cyanosis, clubbing or edema. No rash, lesions or petiche.

## 2012-09-03 NOTE — Patient Instructions (Signed)
Follow-up in two months with Gyn Onc  You look fantastic.   Thank you very much Ms. Unice Cobble for allowing me to provide care for you today.  I appreciate your confidence in choosing our Gynecologic Oncology team.  If you have any questions about your visit today please call our office and we will get back to you as soon as possible.  Maryclare Labrador. Canisha Issac MD., PhD Gynecologic Oncology

## 2012-09-04 ENCOUNTER — Other Ambulatory Visit: Payer: Self-pay | Admitting: Emergency Medicine

## 2012-09-10 ENCOUNTER — Other Ambulatory Visit (HOSPITAL_BASED_OUTPATIENT_CLINIC_OR_DEPARTMENT_OTHER): Payer: BC Managed Care – PPO | Admitting: Lab

## 2012-09-10 ENCOUNTER — Ambulatory Visit (HOSPITAL_BASED_OUTPATIENT_CLINIC_OR_DEPARTMENT_OTHER): Payer: BC Managed Care – PPO

## 2012-09-10 ENCOUNTER — Encounter: Payer: Self-pay | Admitting: Oncology

## 2012-09-10 ENCOUNTER — Telehealth: Payer: Self-pay | Admitting: *Deleted

## 2012-09-10 ENCOUNTER — Ambulatory Visit (HOSPITAL_BASED_OUTPATIENT_CLINIC_OR_DEPARTMENT_OTHER): Payer: BC Managed Care – PPO | Admitting: Oncology

## 2012-09-10 ENCOUNTER — Other Ambulatory Visit: Payer: BC Managed Care – PPO | Admitting: Lab

## 2012-09-10 VITALS — BP 135/98 | HR 105 | Resp 18

## 2012-09-10 VITALS — BP 167/94 | HR 110 | Temp 98.1°F | Resp 20 | Ht 69.84 in | Wt 172.9 lb

## 2012-09-10 DIAGNOSIS — C569 Malignant neoplasm of unspecified ovary: Secondary | ICD-10-CM

## 2012-09-10 DIAGNOSIS — Z5111 Encounter for antineoplastic chemotherapy: Secondary | ICD-10-CM

## 2012-09-10 LAB — COMPREHENSIVE METABOLIC PANEL (CC13)
ALT: 18 U/L (ref 0–55)
AST: 14 U/L (ref 5–34)
Albumin: 3.9 g/dL (ref 3.5–5.0)
Alkaline Phosphatase: 71 U/L (ref 40–150)
BUN: 16.8 mg/dL (ref 7.0–26.0)
CO2: 22 mEq/L (ref 22–29)
Calcium: 10.1 mg/dL (ref 8.4–10.4)
Chloride: 104 mEq/L (ref 98–109)
Creatinine: 0.8 mg/dL (ref 0.6–1.1)
Glucose: 205 mg/dl — ABNORMAL HIGH (ref 70–140)
Potassium: 4 mEq/L (ref 3.5–5.1)
Sodium: 138 mEq/L (ref 136–145)
Total Bilirubin: 0.23 mg/dL (ref 0.20–1.20)
Total Protein: 7.4 g/dL (ref 6.4–8.3)

## 2012-09-10 LAB — CA 125: CA 125: 15.6 U/mL (ref 0.0–30.2)

## 2012-09-10 LAB — CBC WITH DIFFERENTIAL/PLATELET
BASO%: 0.2 % (ref 0.0–2.0)
Basophils Absolute: 0 10*3/uL (ref 0.0–0.1)
EOS%: 0 % (ref 0.0–7.0)
Eosinophils Absolute: 0 10*3/uL (ref 0.0–0.5)
HCT: 37.9 % (ref 34.8–46.6)
HGB: 13.2 g/dL (ref 11.6–15.9)
LYMPH%: 21.3 % (ref 14.0–49.7)
MCH: 30.9 pg (ref 25.1–34.0)
MCHC: 34.7 g/dL (ref 31.5–36.0)
MCV: 89.1 fL (ref 79.5–101.0)
MONO#: 0 10*3/uL — ABNORMAL LOW (ref 0.1–0.9)
MONO%: 0.4 % (ref 0.0–14.0)
NEUT#: 3.2 10*3/uL (ref 1.5–6.5)
NEUT%: 78.1 % — ABNORMAL HIGH (ref 38.4–76.8)
Platelets: 216 10*3/uL (ref 145–400)
RBC: 4.26 10*6/uL (ref 3.70–5.45)
RDW: 15.3 % — ABNORMAL HIGH (ref 11.2–14.5)
WBC: 4.1 10*3/uL (ref 3.9–10.3)
lymph#: 0.9 10*3/uL (ref 0.9–3.3)

## 2012-09-10 MED ORDER — HEPARIN SOD (PORK) LOCK FLUSH 100 UNIT/ML IV SOLN
500.0000 [IU] | Freq: Once | INTRAVENOUS | Status: AC | PRN
  Administered 2012-09-10: 500 [IU]
  Filled 2012-09-10: qty 5

## 2012-09-10 MED ORDER — DIPHENHYDRAMINE HCL 50 MG/ML IJ SOLN
50.0000 mg | Freq: Once | INTRAMUSCULAR | Status: AC
  Administered 2012-09-10: 50 mg via INTRAVENOUS

## 2012-09-10 MED ORDER — DEXAMETHASONE SODIUM PHOSPHATE 20 MG/5ML IJ SOLN
12.0000 mg | Freq: Once | INTRAMUSCULAR | Status: AC
  Administered 2012-09-10: 12 mg via INTRAVENOUS

## 2012-09-10 MED ORDER — PACLITAXEL CHEMO INJECTION 300 MG/50ML
175.0000 mg/m2 | Freq: Once | INTRAVENOUS | Status: AC
  Administered 2012-09-10: 336 mg via INTRAVENOUS
  Filled 2012-09-10: qty 56

## 2012-09-10 MED ORDER — FAMOTIDINE IN NACL 20-0.9 MG/50ML-% IV SOLN
20.0000 mg | Freq: Once | INTRAVENOUS | Status: AC
  Administered 2012-09-10: 20 mg via INTRAVENOUS

## 2012-09-10 MED ORDER — FOSAPREPITANT DIMEGLUMINE INJECTION 150 MG
150.0000 mg | Freq: Once | INTRAVENOUS | Status: AC
  Administered 2012-09-10: 150 mg via INTRAVENOUS
  Filled 2012-09-10: qty 5

## 2012-09-10 MED ORDER — SODIUM CHLORIDE 0.9 % IV SOLN
632.5000 mg | Freq: Once | INTRAVENOUS | Status: AC
  Administered 2012-09-10: 630 mg via INTRAVENOUS
  Filled 2012-09-10: qty 63

## 2012-09-10 MED ORDER — PALONOSETRON HCL INJECTION 0.25 MG/5ML
0.2500 mg | Freq: Once | INTRAVENOUS | Status: AC
  Administered 2012-09-10: 0.25 mg via INTRAVENOUS

## 2012-09-10 MED ORDER — SODIUM CHLORIDE 0.9 % IV SOLN
Freq: Once | INTRAVENOUS | Status: AC
  Administered 2012-09-10: 11:00:00 via INTRAVENOUS

## 2012-09-10 MED ORDER — SODIUM CHLORIDE 0.9 % IJ SOLN
10.0000 mL | INTRAMUSCULAR | Status: DC | PRN
  Administered 2012-09-10: 10 mL
  Filled 2012-09-10: qty 10

## 2012-09-10 NOTE — Telephone Encounter (Signed)
appts made and printed. Pt is aware that i emailed MW to adjust her tx time for 10/01/12...td

## 2012-09-10 NOTE — Patient Instructions (Addendum)
[facility] Discharge Instructions for Patients Receiving Chemotherapy  Today you received the following chemotherapy agents: Taxol and Carboplatin.  To help prevent nausea and vomiting after your treatment, we encourage you to take your nausea medication as prescribed.   If you develop nausea and vomiting that is not controlled by your nausea medication, call the clinic.   BELOW ARE SYMPTOMS THAT SHOULD BE REPORTED IMMEDIATELY:  *FEVER GREATER THAN 100.5 F  *CHILLS WITH OR WITHOUT FEVER  NAUSEA AND VOMITING THAT IS NOT CONTROLLED WITH YOUR NAUSEA MEDICATION  *UNUSUAL SHORTNESS OF BREATH  *UNUSUAL BRUISING OR BLEEDING  TENDERNESS IN MOUTH AND THROAT WITH OR WITHOUT PRESENCE OF ULCERS  *URINARY PROBLEMS  *BOWEL PROBLEMS  UNUSUAL RASH Items with * indicate a potential emergency and should be followed up as soon as possible.  Feel free to call the clinic you have any questions or concerns. The clinic phone number is [phone].    

## 2012-09-11 ENCOUNTER — Telehealth: Payer: Self-pay | Admitting: Medical Oncology

## 2012-09-11 NOTE — Telephone Encounter (Signed)
Patient called requesting results to CA125 drawn 07/24. Informed patient resulted @ 15.6 (WNL). Patient expressed gratitude. Knows to call office with any questions or concerns.

## 2012-09-16 ENCOUNTER — Telehealth: Payer: Self-pay | Admitting: *Deleted

## 2012-09-16 NOTE — Telephone Encounter (Signed)
Patient called reporting her heart rate is increased and has been for the last few days.  Report HR = 116 at this time.  "I feel and hear it in my head. I feel exhausted all day as if I just got off the treadmill."  Asked what she can take to lower her heart rate.  Denies missing any doses of Lotrel which she takes for b/p.  Saw a doctor yesterday and b/p is wnl.  Denies chest pain or palpitations.  Can't deny diaphoresis due to having surgical menopause hot flashes.  Reports feeling wired with the steroids she takes 12 hrs. and 6 hrs. before chemotherapy.  Denies ever having seen a cardiologist but had a stress test years ago that was normal.  Reports he normal heart rate runs about 80.  Scheduled to see Dr. Welton Flakes tomorrow at 1:00 pm.  Will notify providers but instructed her to go to ER is any increased b/p, s.o.b, chest pain, palpitations or diaphoresis.

## 2012-09-16 NOTE — Progress Notes (Signed)
OFFICE PROGRESS NOTE  CC  FRIED, [name], MD [name] 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 She states that she first noticed this pain briefly in 2012 but it was self-limited. She never saw any care. She however continued to have occasional episodes of debilitating pelvic pain that will go if after a few minutes. However in May she had an episode of this pain was acute onset severe lower abdominal/pelvic pain accompanied by nausea and cold sweats. It did not resolve spontaneously showed she presented to the ER for further evaluation. She had a CT scan performed in the emergency department that showed an 11 cm complex left ovarian mass with mild mesenteric/omental stranding, small ascites and peritoneal nodularity. Because of this she was referred to Dr. [name] who performed a transvaginal ultrasound confirming a 11.5 cm complex left ovarian mass no free fluid was noted and the uterus was normal. She had a CEA 125 checked and it was elevated at 1600.  #2She subsequently was seen by Dr. [name] and she underwent on 07/15/2012 total [name] hysterectomy bilateral salpingo-oophorectomy ovarian cancer staging and appendectomy. Intraoperatively patient was found to have a 10 cm left ovary with a cyst containing or blood. The ovarian cyst was hard he ruptured upon entry to the abdomen. The frozen section revealed an adenocarcinoma of the ovary. The left ovarian mass was adherent to the uterine fundus and the sigmoid colon mesentery. The left ureter was dilated to 1 cm prior to the mass removal. The pathology showed a carcinosarcoma, [name] is type, consisting of mixed endometrioid, clear cell, mucinous, and squamous cell carcinoma components and [name] sarcomatous component. Tumor necrosis and acute hemorrhage is present tumor size was 10.5 x  8.0 x 8.5 cm no ovarian surface involvement was identified no lymphovascular invasion identified. Left fallopian tube the serosal fibrovascular decisions were noted mild chronic salpingitis no tumor was seen. Tumor did involve left ovary and periureteral nodule only. Her surgery was performed at [facility]. She was seen by oncology there and patient was started on carboplatinum plus paclitaxel. Overall she tolerated it well  #3 patient is now to commence undergoing adjuvant chemotherapy consisting of Taxol and carboplatinum.this is given every 21 days. First cycle was given in [place].  CURRENT THERAPY:status postcycle 2 of Taxol carboplatinum to be administered on 08/20/2012  INTERVAL HISTORY: Morgan Olsen 52 y.o. female returns for followup visit  She has a lot of complaints  She is very anxious nervous as usual. She has no fevers chills night sweats headaches shortness of breath chest pains palpitations no nausea or vomiting. We discussed how to use her anti-emetics.She has no bleeding problems. Remainder of the 10 point review of systems is negative  MEDICAL HISTORY: Past Medical History  Diagnosis Date  . Hypertension   . Asthma   . Cancer   . Hypertension 08/04/2012  . Exercise-induced asthma 08/04/2012    ALLERGIES:  is allergic to sulfa antibiotics.  MEDICATIONS:  Current Outpatient Prescriptions  Medication Sig Dispense Refill  . ALPRAZolam (XANAX) 0.25 MG tablet Take 1 tablet (0.25 mg total) by mouth at bedtime as needed for sleep.  30 tablet  3  . amLODipine-benazepril (LOTREL) 5-40 MG per capsule Take 1 capsule by mouth daily.      Marland Kitchen dexamethasone (DECADRON) 4 MG tablet Take 2 tablets (  8 mg total) by mouth 2 (two) times daily with a meal. Take two times a day starting the day after chemotherapy for 3 days.  30 tablet  1  . DiphenhydrAMINE HCl (BENADRYL PO) Take by mouth.      Tery Sanfilippo Sodium (COLACE PO) Take by mouth.      . gabapentin (NEURONTIN) 100 MG capsule Take 1  capsule (100 mg total) by mouth 3 (three) times daily.  90 capsule  6  . lidocaine-prilocaine (EMLA) cream Apply topically as needed.  30 g  6  . lidocaine-prilocaine (EMLA) cream Apply topically as needed.  30 g  0  . montelukast (SINGULAIR) 10 MG tablet Take 10 mg by mouth at bedtime.      . ondansetron (ZOFRAN ODT) 4 MG disintegrating tablet Take 1 tablet (4 mg total) by mouth every 8 (eight) hours as needed for nausea.  10 tablet  0  . ondansetron (ZOFRAN) 8 MG tablet Take 1 tablet (8 mg total) by mouth 2 (two) times daily. Take two times a day starting the day after chemo for 3 days. Then take two times a day as needed for nausea or vomiting.  30 tablet  1  . oxycodone (OXY-IR) 5 MG capsule Take 1 capsule (5 mg total) by mouth every 6 (six) hours as needed.  90 capsule  0  . pyridOXINE (VITAMIN B-6) 100 MG tablet Take 200 mg by mouth daily.      Marland Kitchen HYDROcodone-acetaminophen (NORCO/VICODIN) 5-325 MG per tablet Take 2 tablets by mouth every 4 (four) hours as needed.  16 tablet  0  . ibuprofen (ADVIL,MOTRIN) 800 MG tablet Take 800 mg by mouth every 8 (eight) hours as needed for pain.      Marland Kitchen LORazepam (ATIVAN) 0.5 MG tablet Take 1 tablet (0.5 mg total) by mouth every 6 (six) hours as needed (Nausea or vomiting).  30 tablet  0  . LORazepam (ATIVAN) 1 MG tablet Take 1 tablet (1 mg total) by mouth 3 (three) times daily as needed for anxiety.  15 tablet  0  . prochlorperazine (COMPAZINE) 10 MG tablet Take 1 tablet (10 mg total) by mouth every 6 (six) hours as needed (Nausea or vomiting).  30 tablet  1  . prochlorperazine (COMPAZINE) 25 MG suppository Place 1 suppository (25 mg total) rectally every 12 (twelve) hours as needed for nausea.  12 suppository  3  . Prochlorperazine Maleate (COMPAZINE PO) Take by mouth.      . Promethazine HCl (PHENERGAN PO) Take by mouth.       No current facility-administered medications for this visit.    SURGICAL HISTORY:  Past Surgical History  Procedure Laterality  Date  . Cesarean section    . Tonsillectomy    . Ablation    . Abdominal hysterectomy  07/15/2012    @ [facility]    REVIEW OF SYSTEMS:  Pertinent items are noted in HPI.   HEALTH MAINTENANCE:  PHYSICAL EXAMINATION: Blood pressure 147/94, pulse 97, temperature 97.8 F (36.6 C), temperature source Oral, resp. rate 20, height 5\' 10"  (1.778 m), weight 168 lb (76.204 kg). Body mass index is 24.11 kg/(m^2). ECOG PERFORMANCE STATUS: 0 - Asymptomatic   General appearance: alert, cooperative and appears stated age Resp: clear to auscultation bilaterally Cardio: regular rate and rhythm GI: soft, non-tender; bowel sounds normal; no masses,  no organomegaly Extremities: extremities normal, atraumatic, no cyanosis or edema Neurologic: Grossly normal   LABORATORY DATA: Lab Results  Component Value Date   WBC  4.1 09/10/2012   HGB 13.2 09/10/2012   HCT 37.9 09/10/2012   MCV 89.1 09/10/2012   PLT 216 09/10/2012      Chemistry      Component Value Date/Time   NA 138 09/10/2012 0851   NA 138 07/08/2012 1655   K 4.0 09/10/2012 0851   K 3.8 07/08/2012 1655   CL 99 08/04/2012 1135   CL 100 07/08/2012 1655   CO2 22 09/10/2012 0851   CO2 26 07/08/2012 1655   BUN 16.8 09/10/2012 0851   BUN 13 07/08/2012 1655   CREATININE 0.8 09/10/2012 0851   CREATININE 0.70 07/08/2012 1655      Component Value Date/Time   CALCIUM 10.1 09/10/2012 0851   CALCIUM 9.7 07/08/2012 1655   ALKPHOS 71 09/10/2012 0851   ALKPHOS 73 07/08/2012 1655   AST 14 09/10/2012 0851   AST 15 07/08/2012 1655   ALT 18 09/10/2012 0851   ALT 16 07/08/2012 1655   BILITOT 0.23 09/10/2012 0851   BILITOT 0.3 07/08/2012 1655       RADIOGRAPHIC STUDIES:  No results found.  ASSESSMENT: 52 year old female with   #1stage IIB carcinosarcoma of the ovaries status post surgery on 07/15/2012 consisting of total abdominal hysterectomy bilateral salpingo-oophorectomy for ovarian cancer staging and appendectomy. This was done at [facility] by Dr. Laurette Schimke.  #2 adjuvant chemotherapy started at [facility] she received her first cycle day or consisting of Taxol and carboplatinum. She will now receive cycle #2 on 08/20/2012 of Taxol and carboplatinum every 21 days for total of 6 cycles. Patient understands risks benefits and side effects of chemotherapy in clear terms. She does have a Port-A-Cath placed.   PLAN:  #1 patient is doing well clinically.  #2 she will be seen back on 09/10/2012 for cycle #3 of Taxol and carboplatinum.   All questions were answered. The patient knows to call the clinic with any problems, questions or concerns. We can certainly see the patient much sooner if necessary.  I spent 25 minutes counseling the patient face to face. The total time spent in the appointment was 30 minutes.    Drue Second, MD Medical/Oncology [facility] [phone] (beeper) [phone] (Office)

## 2012-09-17 ENCOUNTER — Ambulatory Visit (HOSPITAL_BASED_OUTPATIENT_CLINIC_OR_DEPARTMENT_OTHER): Payer: BC Managed Care – PPO | Admitting: Oncology

## 2012-09-17 ENCOUNTER — Other Ambulatory Visit (HOSPITAL_BASED_OUTPATIENT_CLINIC_OR_DEPARTMENT_OTHER): Payer: BC Managed Care – PPO | Admitting: Lab

## 2012-09-17 VITALS — BP 130/80 | HR 101 | Temp 97.2°F | Resp 20 | Ht 69.84 in | Wt 170.6 lb

## 2012-09-17 DIAGNOSIS — C569 Malignant neoplasm of unspecified ovary: Secondary | ICD-10-CM

## 2012-09-17 DIAGNOSIS — J45990 Exercise induced bronchospasm: Secondary | ICD-10-CM

## 2012-09-17 DIAGNOSIS — R000 Tachycardia, unspecified: Secondary | ICD-10-CM

## 2012-09-17 LAB — CBC WITH DIFFERENTIAL/PLATELET
BASO%: 0.6 % (ref 0.0–2.0)
Basophils Absolute: 0 10*3/uL (ref 0.0–0.1)
EOS%: 0 % (ref 0.0–7.0)
Eosinophils Absolute: 0 10*3/uL (ref 0.0–0.5)
HCT: 36.5 % (ref 34.8–46.6)
HGB: 12.3 g/dL (ref 11.6–15.9)
LYMPH%: 51.1 % — ABNORMAL HIGH (ref 14.0–49.7)
MCH: 30.2 pg (ref 25.1–34.0)
MCHC: 33.7 g/dL (ref 31.5–36.0)
MCV: 89.7 fL (ref 79.5–101.0)
MONO#: 0.1 10*3/uL (ref 0.1–0.9)
MONO%: 2.5 % (ref 0.0–14.0)
NEUT#: 1.2 10*3/uL — ABNORMAL LOW (ref 1.5–6.5)
NEUT%: 45.8 % (ref 38.4–76.8)
Platelets: 166 10*3/uL (ref 145–400)
RBC: 4.07 10*6/uL (ref 3.70–5.45)
RDW: 15 % — ABNORMAL HIGH (ref 11.2–14.5)
WBC: 2.6 10*3/uL — ABNORMAL LOW (ref 3.9–10.3)
lymph#: 1.3 10*3/uL (ref 0.9–3.3)

## 2012-09-17 LAB — COMPREHENSIVE METABOLIC PANEL (CC13)
ALT: 20 U/L (ref 0–55)
AST: 16 U/L (ref 5–34)
Albumin: 3.7 g/dL (ref 3.5–5.0)
Alkaline Phosphatase: 64 U/L (ref 40–150)
BUN: 21.7 mg/dL (ref 7.0–26.0)
CO2: 30 mEq/L — ABNORMAL HIGH (ref 22–29)
Calcium: 10 mg/dL (ref 8.4–10.4)
Chloride: 103 mEq/L (ref 98–109)
Creatinine: 0.8 mg/dL (ref 0.6–1.1)
Glucose: 116 mg/dl (ref 70–140)
Potassium: 4 mEq/L (ref 3.5–5.1)
Sodium: 141 mEq/L (ref 136–145)
Total Bilirubin: 0.35 mg/dL (ref 0.20–1.20)
Total Protein: 7 g/dL (ref 6.4–8.3)

## 2012-09-17 NOTE — Progress Notes (Signed)
OFFICE PROGRESS NOTE  CC  Morgan Olsen, Morgan Cheadle, MD [address] 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 She states that she first noticed this pain briefly in 2012 but it was self-limited. She never saw any care. She however continued to have occasional episodes of debilitating pelvic pain that will go if after a few minutes. However in May she had an episode of this pain was acute onset severe lower abdominal/pelvic pain accompanied by nausea and cold sweats. It did not resolve spontaneously showed she presented to the ER for further evaluation. She had a CT scan performed in the emergency department that showed an 11 cm complex left ovarian mass with mild mesenteric/omental stranding, small ascites and peritoneal nodularity. Because of this she was referred to Dr. [name] who performed a transvaginal ultrasound confirming a 11.5 cm complex left ovarian mass no free fluid was noted and the uterus was normal. She had a CEA 125 checked and it was elevated at 1600.  #2She subsequently was seen by Dr. [name] and she underwent on 07/15/2012 total [name] hysterectomy bilateral salpingo-oophorectomy ovarian cancer staging and appendectomy. Intraoperatively patient was found to have a 10 cm left ovary with a cyst containing or blood. The ovarian cyst was hard he ruptured upon entry to the abdomen. The frozen section revealed an adenocarcinoma of the ovary. The left ovarian mass was adherent to the uterine fundus and the sigmoid colon mesentery. The left ureter was dilated to 1 cm prior to the mass removal. The pathology showed a carcinosarcoma, [name] is type, consisting of mixed endometrioid, clear cell, mucinous, and squamous cell carcinoma components and [name] sarcomatous component. Tumor necrosis and acute hemorrhage is present tumor size was 10.5 x  8.0 x 8.5 cm no ovarian surface involvement was identified no lymphovascular invasion identified. Left fallopian tube the serosal fibrovascular decisions were noted mild chronic salpingitis no tumor was seen. Tumor did involve left ovary and periureteral nodule only. Her surgery was performed at [facility]. She was seen by oncology there and patient was started on carboplatinum plus paclitaxel. Overall she tolerated it well  #3 patient is now to commence undergoing adjuvant chemotherapy consisting of Taxol and carboplatinum.this is given every 21 days. First cycle was given in [place].  CURRENT THERAPY:status post cycle 3 of Taxol carboplatinum to be administered on 09/10/2012  INTERVAL HISTORY: Morgan Olsen 52 y.o. female returns for followup visit  She has a lot of complaints  She is very anxious nervous as usual. She has no fevers chills night sweats headaches shortness of breath chest pains palpitations no nausea or vomiting. We discussed how to use her anti-emetics.She has no bleeding problems. She has developed tachycardia unclear etiology. Remainder of the 10 point review of systems is negative  MEDICAL HISTORY: Past Medical History  Diagnosis Date  . Hypertension   . Asthma   . Cancer   . Hypertension 08/04/2012  . Exercise-induced asthma 08/04/2012    ALLERGIES:  is allergic to sulfa antibiotics.  MEDICATIONS:  Current Outpatient Prescriptions  Medication Sig Dispense Refill  . ALPRAZolam (XANAX) 0.25 MG tablet Take 1 tablet (0.25 mg total) by mouth at bedtime as needed for sleep.  30 tablet  3  . amLODipine-benazepril (LOTREL) 5-40 MG per capsule Take 1 capsule by mouth daily.      Marland Kitchen dexamethasone (  DECADRON) 4 MG tablet Take 2 tablets (8 mg total) by mouth 2 (two) times daily with a meal. Take two times a day starting the day after chemotherapy for 3 days.  30 tablet  1  . DiphenhydrAMINE HCl (BENADRYL PO) Take by mouth.      Tery Sanfilippo Sodium (COLACE PO) Take by mouth.       . gabapentin (NEURONTIN) 100 MG capsule Take 1 capsule (100 mg total) by mouth 3 (three) times daily.  90 capsule  6  . HYDROcodone-acetaminophen (NORCO/VICODIN) 5-325 MG per tablet Take 2 tablets by mouth every 4 (four) hours as needed.  16 tablet  0  . ibuprofen (ADVIL,MOTRIN) 800 MG tablet Take 800 mg by mouth every 8 (eight) hours as needed for pain.      Marland Kitchen lidocaine-prilocaine (EMLA) cream Apply topically as needed.  30 g  6  . lidocaine-prilocaine (EMLA) cream Apply topically as needed.  30 g  0  . LORazepam (ATIVAN) 0.5 MG tablet Take 1 tablet (0.5 mg total) by mouth every 6 (six) hours as needed (Nausea or vomiting).  30 tablet  0  . LORazepam (ATIVAN) 1 MG tablet Take 1 tablet (1 mg total) by mouth 3 (three) times daily as needed for anxiety.  15 tablet  0  . montelukast (SINGULAIR) 10 MG tablet Take 10 mg by mouth at bedtime.      . ondansetron (ZOFRAN ODT) 4 MG disintegrating tablet Take 1 tablet (4 mg total) by mouth every 8 (eight) hours as needed for nausea.  10 tablet  0  . ondansetron (ZOFRAN) 8 MG tablet Take 1 tablet (8 mg total) by mouth 2 (two) times daily. Take two times a day starting the day after chemo for 3 days. Then take two times a day as needed for nausea or vomiting.  30 tablet  1  . oxycodone (OXY-IR) 5 MG capsule Take 1 capsule (5 mg total) by mouth every 6 (six) hours as needed.  90 capsule  0  . prochlorperazine (COMPAZINE) 10 MG tablet Take 1 tablet (10 mg total) by mouth every 6 (six) hours as needed (Nausea or vomiting).  30 tablet  1  . prochlorperazine (COMPAZINE) 25 MG suppository Place 1 suppository (25 mg total) rectally every 12 (twelve) hours as needed for nausea.  12 suppository  3  . Prochlorperazine Maleate (COMPAZINE PO) Take by mouth.      . Promethazine HCl (PHENERGAN PO) Take by mouth.      . pyridOXINE (VITAMIN B-6) 100 MG tablet Take 200 mg by mouth daily.       No current facility-administered medications for this visit.    SURGICAL HISTORY:   Past Surgical History  Procedure Laterality Date  . Cesarean section    . Tonsillectomy    . Ablation    . Abdominal hysterectomy  07/15/2012    @ [facility]    REVIEW OF SYSTEMS:  Pertinent items are noted in HPI.   HEALTH MAINTENANCE:  PHYSICAL EXAMINATION: Blood pressure 130/80, pulse 101, temperature 97.2 F (36.2 C), temperature source Oral, resp. rate 20, height 5' 9.84" (1.774 m), weight 170 lb 9.6 oz (77.384 kg). Body mass index is 24.59 kg/(m^2). ECOG PERFORMANCE STATUS: 0 - Asymptomatic   General appearance: alert, cooperative and appears stated age Resp: clear to auscultation bilaterally Cardio: regular rate and rhythm GI: soft, non-tender; bowel sounds normal; no masses,  no organomegaly Extremities: extremities normal, atraumatic, no cyanosis or edema Neurologic: Grossly normal   LABORATORY DATA:  Lab Results  Component Value Date   WBC 2.6* 09/17/2012   HGB 12.3 09/17/2012   HCT 36.5 09/17/2012   MCV 89.7 09/17/2012   PLT 166 09/17/2012      Chemistry      Component Value Date/Time   NA 141 09/17/2012 1228   NA 138 07/08/2012 1655   K 4.0 09/17/2012 1228   K 3.8 07/08/2012 1655   CL 99 08/04/2012 1135   CL 100 07/08/2012 1655   CO2 30* 09/17/2012 1228   CO2 26 07/08/2012 1655   BUN 21.7 09/17/2012 1228   BUN 13 07/08/2012 1655   CREATININE 0.8 09/17/2012 1228   CREATININE 0.70 07/08/2012 1655      Component Value Date/Time   CALCIUM 10.0 09/17/2012 1228   CALCIUM 9.7 07/08/2012 1655   ALKPHOS 64 09/17/2012 1228   ALKPHOS 73 07/08/2012 1655   AST 16 09/17/2012 1228   AST 15 07/08/2012 1655   ALT 20 09/17/2012 1228   ALT 16 07/08/2012 1655   BILITOT 0.35 09/17/2012 1228   BILITOT 0.3 07/08/2012 1655       RADIOGRAPHIC STUDIES:  No results found.  ASSESSMENT: 52 year old female with   #1stage IIB carcinosarcoma of the ovaries status post surgery on 07/15/2012 consisting of total abdominal hysterectomy bilateral salpingo-oophorectomy for ovarian cancer staging and  appendectomy. This was done at [facility] by Dr. Laurette Schimke.  #2 adjuvant chemotherapy started at [facility] she received her first cycle day or consisting of Taxol and carboplatinum. She will now receive cycle #2 on 08/20/2012 of Taxol and carboplatinum every 21 days for total of 6 cycles. Patient understands risks benefits and side effects of chemotherapy in clear terms. She does have a Port-A-Cath placed.  #3 patient received Taxol carboplatinum on 09/10/2012 she tolerated this cycle quite well. She however did develop tachycardia blood pressures have been stable.  #4 tachycardia unclear cause. She is not anemic.   PLAN:  #1 I have recommended that patient be seen by Dr. freed for further evaluation regarding her tachycardia she may require a different manner to bring her blood pressure down as well as her heart rate. She does not seem to be dehydrated. I did offer the patient to do an EKG here today as well as to get her into a cardiologist. However she was in a hurry to take her daughter to the doctors. And she will also followup with Dr. freed.  #2 patient will be seen back in about 2 weeks' time for cycle 3 of her chemotherapy.  All questions were answered. The patient knows to call the clinic with any problems, questions or concerns. We can certainly see the patient much sooner if necessary.  I spent 25 minutes counseling the patient face to face. The total time spent in the appointment was 30 minutes.    Drue Second, MD Medical/Oncology [facility] [phone] (beeper) [phone] (Office)

## 2012-09-22 ENCOUNTER — Encounter: Payer: Self-pay | Admitting: Oncology

## 2012-09-23 ENCOUNTER — Encounter: Payer: Self-pay | Admitting: Gynecologic Oncology

## 2012-09-23 ENCOUNTER — Telehealth: Payer: Self-pay | Admitting: Medical Oncology

## 2012-09-23 DIAGNOSIS — C569 Malignant neoplasm of unspecified ovary: Secondary | ICD-10-CM

## 2012-09-23 NOTE — Telephone Encounter (Signed)
F/u to patient's email dated 08/05.  Per MD, patient to take combination of Lotensin 40 and Toprol 25 (as pt listed), beta blockers with not interfere with rescue meds, and patient is to take the same scheduled/amount of steroid for 08/14.  Request for CA 125 lab draw ordered.  Patient expressed verbal understanding and thanks, confirmed 08/14 appt. Denies further questions at this time.

## 2012-09-25 ENCOUNTER — Telehealth: Payer: Self-pay | Admitting: *Deleted

## 2012-09-25 ENCOUNTER — Telehealth: Payer: Self-pay | Admitting: Oncology

## 2012-09-25 NOTE — Telephone Encounter (Signed)
Morgan Olsen called asking for her records to be sent to [facility].  Has spoken with staff at [facility] and [facility] informed her to transfer records to them to return to [facility] for Care.  Instructed to come in to medical records to sign release.  Will notify staff.

## 2012-09-25 NOTE — Telephone Encounter (Signed)
Records faxed to [facility] to [facility] [phone].

## 2012-09-27 NOTE — Progress Notes (Signed)
OFFICE PROGRESS NOTE  CC  Morgan Olsen, Morgan Olsen [place], MD [address] 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 She states that she first noticed this pain briefly in 2012 but it was self-limited. She never saw any care. She however continued to have occasional episodes of debilitating pelvic pain that will go if after a few minutes. However in May she had an episode of this pain was acute onset severe lower abdominal/pelvic pain accompanied by nausea and cold sweats. It did not resolve spontaneously showed she presented to the ER for further evaluation. She had a CT scan performed in the emergency department that showed an 11 cm complex left ovarian mass with mild mesenteric/omental stranding, small ascites and peritoneal nodularity. Because of this she was referred to Dr. [name] who performed a transvaginal ultrasound confirming a 11.5 cm complex left ovarian mass no free fluid was noted and the uterus was normal. She had a CEA 125 checked and it was elevated at 1600.  #2She subsequently was seen by Dr. [name] and she underwent on 07/15/2012 total [name] hysterectomy bilateral salpingo-oophorectomy ovarian cancer staging and appendectomy. Intraoperatively patient was found to have a 10 cm left ovary with a cyst containing or blood. The ovarian cyst was hard he ruptured upon entry to the abdomen. The frozen section revealed an adenocarcinoma of the ovary. The left ovarian mass was adherent to the uterine fundus and the sigmoid colon mesentery. The left ureter was dilated to 1 cm prior to the mass removal. The pathology showed a carcinosarcoma, [name] is type, consisting of mixed endometrioid, clear cell, mucinous, and squamous cell carcinoma components and [name] sarcomatous component. Tumor necrosis and acute hemorrhage is present tumor size was 10.5 x  8.0 x 8.5 cm no ovarian surface involvement was identified no lymphovascular invasion identified. Left fallopian tube the serosal fibrovascular decisions were noted mild chronic salpingitis no tumor was seen. Tumor did involve left ovary and periureteral nodule only. Her surgery was performed at [facility]. She was seen by oncology there and patient was started on carboplatinum plus paclitaxel. Overall she tolerated it well  #3 patient is now to commence undergoing adjuvant chemotherapy consisting of Taxol and carboplatinum.this is given every 21 days. First cycle was given in [place].  CURRENT THERAPY: cycle 3 of Taxol carboplatinum  INTERVAL HISTORY: Morgan Olsen 52 y.o. female returns for followup visit   She is very anxious nervous as usual. She has no fevers chills night sweats headaches shortness of breath chest pains palpitations no nausea or vomiting. We discussed how to use her anti-emetics.She has no bleeding problems. Remainder of the 10 point review of systems is negative  MEDICAL HISTORY: Past Medical History  Diagnosis Date  . Hypertension   . Asthma   . Cancer   . Hypertension 08/04/2012  . Exercise-induced asthma 08/04/2012    ALLERGIES:  is allergic to sulfa antibiotics.  MEDICATIONS:  Current Outpatient Prescriptions  Medication Sig Dispense Refill  . ALPRAZolam (XANAX) 0.25 MG tablet Take 1 tablet (0.25 mg total) by mouth at bedtime as needed for sleep.  30 tablet  3  . amLODipine-benazepril (LOTREL) 5-40 MG per capsule Take 1 capsule by mouth daily.      Marland Kitchen dexamethasone (DECADRON) 4 MG tablet Take 2 tablets (8 mg total) by mouth 2 (two) times daily with a  meal. Take two times a day starting the day after chemotherapy for 3 days.  30 tablet  1  . Docusate Sodium (COLACE PO) Take by mouth.      . gabapentin (NEURONTIN) 100 MG capsule Take 1 capsule (100 mg total) by mouth 3 (three) times daily.  90 capsule  6  . ibuprofen (ADVIL,MOTRIN) 800 MG tablet Take 800 mg by  mouth every 8 (eight) hours as needed for pain.      Marland Kitchen lidocaine-prilocaine (EMLA) cream Apply topically as needed.  30 g  6  . lidocaine-prilocaine (EMLA) cream Apply topically as needed.  30 g  0  . LORazepam (ATIVAN) 0.5 MG tablet Take 1 tablet (0.5 mg total) by mouth every 6 (six) hours as needed (Nausea or vomiting).  30 tablet  0  . LORazepam (ATIVAN) 1 MG tablet Take 1 tablet (1 mg total) by mouth 3 (three) times daily as needed for anxiety.  15 tablet  0  . montelukast (SINGULAIR) 10 MG tablet Take 10 mg by mouth at bedtime.      . ondansetron (ZOFRAN ODT) 4 MG disintegrating tablet Take 1 tablet (4 mg total) by mouth every 8 (eight) hours as needed for nausea.  10 tablet  0  . ondansetron (ZOFRAN) 8 MG tablet Take 1 tablet (8 mg total) by mouth 2 (two) times daily. Take two times a day starting the day after chemo for 3 days. Then take two times a day as needed for nausea or vomiting.  30 tablet  1  . oxycodone (OXY-IR) 5 MG capsule Take 1 capsule (5 mg total) by mouth every 6 (six) hours as needed.  90 capsule  0  . prochlorperazine (COMPAZINE) 10 MG tablet Take 1 tablet (10 mg total) by mouth every 6 (six) hours as needed (Nausea or vomiting).  30 tablet  1  . pyridOXINE (VITAMIN B-6) 100 MG tablet Take 200 mg by mouth daily.      . DiphenhydrAMINE HCl (BENADRYL PO) Take by mouth.      Marland Kitchen HYDROcodone-acetaminophen (NORCO/VICODIN) 5-325 MG per tablet Take 2 tablets by mouth every 4 (four) hours as needed.  16 tablet  0  . prochlorperazine (COMPAZINE) 25 MG suppository Place 1 suppository (25 mg total) rectally every 12 (twelve) hours as needed for nausea.  12 suppository  3  . Prochlorperazine Maleate (COMPAZINE PO) Take by mouth.      . Promethazine HCl (PHENERGAN PO) Take by mouth.       No current facility-administered medications for this visit.    SURGICAL HISTORY:  Past Surgical History  Procedure Laterality Date  . Cesarean section    . Tonsillectomy    . Ablation    .  Abdominal hysterectomy  07/15/2012    @ [facility]    REVIEW OF SYSTEMS:  Pertinent items are noted in HPI.   HEALTH MAINTENANCE:  PHYSICAL EXAMINATION: Blood pressure 167/94, pulse 110, temperature 98.1 F (36.7 C), temperature source Oral, resp. rate 20, height 5' 9.84" (1.774 m), weight 172 lb 14.4 oz (78.427 kg). Body mass index is 24.92 kg/(m^2). ECOG PERFORMANCE STATUS: 0 - Asymptomatic   General appearance: alert, cooperative and appears stated age Resp: clear to auscultation bilaterally Cardio: regular rate and rhythm GI: soft, non-tender; bowel sounds normal; no masses,  no organomegaly Extremities: extremities normal, atraumatic, no cyanosis or edema Neurologic: Grossly normal   LABORATORY DATA: Lab Results  Component Value Date   WBC 2.6* 09/17/2012   HGB 12.3 09/17/2012  HCT 36.5 09/17/2012   MCV 89.7 09/17/2012   PLT 166 09/17/2012      Chemistry      Component Value Date/Time   NA 141 09/17/2012 1228   NA 138 07/08/2012 1655   K 4.0 09/17/2012 1228   K 3.8 07/08/2012 1655   CL 99 08/04/2012 1135   CL 100 07/08/2012 1655   CO2 30* 09/17/2012 1228   CO2 26 07/08/2012 1655   BUN 21.7 09/17/2012 1228   BUN 13 07/08/2012 1655   CREATININE 0.8 09/17/2012 1228   CREATININE 0.70 07/08/2012 1655      Component Value Date/Time   CALCIUM 10.0 09/17/2012 1228   CALCIUM 9.7 07/08/2012 1655   ALKPHOS 64 09/17/2012 1228   ALKPHOS 73 07/08/2012 1655   AST 16 09/17/2012 1228   AST 15 07/08/2012 1655   ALT 20 09/17/2012 1228   ALT 16 07/08/2012 1655   BILITOT 0.35 09/17/2012 1228   BILITOT 0.3 07/08/2012 1655       RADIOGRAPHIC STUDIES:  No results found.  ASSESSMENT: 52 year old female with   #1stage IIB carcinosarcoma of the ovaries status post surgery on 07/15/2012 consisting of total abdominal hysterectomy bilateral salpingo-oophorectomy for ovarian cancer staging and appendectomy. This was done at [facility] by Dr. Laurette Schimke.  #2 adjuvant chemotherapy started at [facility]  she received her first cycle day or consisting of Taxol and carboplatinum. She will now receive cycle #3 on 09/10/2012 of Taxol and carboplatinum every 21 days for total of 6 cycles. Patient understands risks benefits and side effects of chemotherapy in clear terms.   PLAN:  #1patient will proceed with cycle #3 of Taxol and carboplatinum.  She is receiving Aloxi and Emend with her treatment today.  #3 she will be seen back in one week's time for followup.   All questions were answered. The patient knows to call the clinic with any problems, questions or concerns. We can certainly see the patient much sooner if necessary.  I spent 25 minutes counseling the patient face to face. The total time spent in the appointment was 30 minutes.    Drue Second, MD Medical/Oncology [facility] [phone] (beeper) [phone] (Office)

## 2012-09-30 ENCOUNTER — Telehealth: Payer: Self-pay | Admitting: Oncology

## 2012-10-01 ENCOUNTER — Ambulatory Visit: Payer: BC Managed Care – PPO

## 2012-10-01 ENCOUNTER — Ambulatory Visit: Payer: BC Managed Care – PPO | Admitting: Oncology

## 2012-10-01 ENCOUNTER — Other Ambulatory Visit: Payer: BC Managed Care – PPO | Admitting: Lab

## 2012-10-22 ENCOUNTER — Other Ambulatory Visit: Payer: BC Managed Care – PPO | Admitting: Lab

## 2012-11-05 ENCOUNTER — Ambulatory Visit: Payer: BC Managed Care – PPO | Admitting: Gynecologic Oncology

## 2012-11-12 ENCOUNTER — Other Ambulatory Visit: Payer: BC Managed Care – PPO | Admitting: Lab

## 2012-11-18 ENCOUNTER — Telehealth: Payer: Self-pay | Admitting: Cardiology

## 2012-11-18 NOTE — Telephone Encounter (Signed)
New Problem  BP medications were changed recently// pt is calling give an up date. Pt states the systolic number is really low.. Pt is feels weak and is not sure if it is the medication or the Chemo.   Last two readings are 100/70 and 99/71.

## 2012-11-19 NOTE — Telephone Encounter (Signed)
Pt is aware of med change of  25mg  1 tablet twice daily

## 2012-11-19 NOTE — Telephone Encounter (Signed)
Please have patient decrease metoprolol tartrated 25mg  1 tablet twice daily and have her check her BP daily for a week and call with the results

## 2012-11-30 ENCOUNTER — Encounter: Payer: Self-pay | Admitting: Cardiology

## 2012-12-02 ENCOUNTER — Telehealth: Payer: Self-pay | Admitting: Oncology

## 2012-12-02 NOTE — Telephone Encounter (Signed)
Faxed pt medical records to [facility]-per pt

## 2012-12-03 ENCOUNTER — Encounter: Payer: Self-pay | Admitting: Cardiology

## 2012-12-03 ENCOUNTER — Ambulatory Visit (INDEPENDENT_AMBULATORY_CARE_PROVIDER_SITE_OTHER): Payer: BC Managed Care – PPO | Admitting: Cardiology

## 2012-12-03 VITALS — BP 126/76 | HR 67 | Ht 70.0 in | Wt 178.0 lb

## 2012-12-03 DIAGNOSIS — R079 Chest pain, unspecified: Secondary | ICD-10-CM

## 2012-12-03 DIAGNOSIS — R0602 Shortness of breath: Secondary | ICD-10-CM

## 2012-12-03 DIAGNOSIS — I5189 Other ill-defined heart diseases: Secondary | ICD-10-CM | POA: Insufficient documentation

## 2012-12-03 DIAGNOSIS — I519 Heart disease, unspecified: Secondary | ICD-10-CM

## 2012-12-03 DIAGNOSIS — I10 Essential (primary) hypertension: Secondary | ICD-10-CM

## 2012-12-03 DIAGNOSIS — I77810 Thoracic aortic ectasia: Secondary | ICD-10-CM

## 2012-12-03 NOTE — Patient Instructions (Signed)
Your physician recommends that you continue on your current medications as directed. Please refer to the Current Medication list given to you today.  Your physician has requested that you have en exercise stress myoview. For further information please visit [email or web address] Please follow instruction sheet, as given.  Your physician recommends that you schedule a follow-up appointment in: 6 months with Dr. Mayford Knife

## 2012-12-03 NOTE — Progress Notes (Signed)
[address] [place], [place]  [address] Phone: [phone] Fax:  [phone]  Date:  12/03/2012   ID:  Morgan Olsen, DOB [birth year], MRN [id number]  PCP:  Lenora Boys, MD  Cardiologist:  Armanda Magic, MD     History of Present Illness: Morgan Olsen is a 52 y.o. female with a history of HTN, diastolic dysfunction and midly dilated aortic root by echo who presents today for followup.  She is doing well.  She denies any chest pain, SOB, DOE, LE edema, dizziness, palpitations or syncope.  She thinks that her SOB is related to the chemo and is very cyclical with her chemo.  She is also out of shape from being very sedentary. Her 2d echo showed low normal LVF with EF 50-55% with grade I diastolic dysfunction, mildly dilated aortic root and trivial MR.     Wt Readings from Last 3 Encounters:  09/17/12 170 lb 9.6 oz (77.384 kg)  09/10/12 172 lb 14.4 oz (78.427 kg)  09/03/12 169 lb 6.4 oz (76.839 kg)     Past Medical History  Diagnosis Date  . Hypertension   . Asthma   . Cancer   . Hypertension 08/04/2012  . Exercise-induced asthma 08/04/2012  . Asthma   . Allergic rhinitis   . Ovarian cancer     Current Outpatient Prescriptions  Medication Sig Dispense Refill  . ALPRAZolam (XANAX) 0.25 MG tablet Take 1 tablet (0.25 mg total) by mouth at bedtime as needed for sleep.  30 tablet  3  . amLODipine-benazepril (LOTREL) 5-40 MG per capsule Take 1 capsule by mouth daily.      Marland Kitchen dexamethasone (DECADRON) 4 MG tablet Take 2 tablets (8 mg total) by mouth 2 (two) times daily with a meal. Take two times a day starting the day after chemotherapy for 3 days.  30 tablet  1  . DiphenhydrAMINE HCl (BENADRYL PO) Take by mouth.      Tery Sanfilippo Sodium (COLACE PO) Take by mouth.      . gabapentin (NEURONTIN) 100 MG capsule Take 1 capsule (100 mg total) by mouth 3 (three) times daily.  90 capsule  6  . HYDROcodone-acetaminophen (NORCO/VICODIN) 5-325 MG per tablet Take 2 tablets by mouth every 4  (four) hours as needed.  16 tablet  0  . ibuprofen (ADVIL,MOTRIN) 800 MG tablet Take 800 mg by mouth every 8 (eight) hours as needed for pain.      Marland Kitchen lidocaine-prilocaine (EMLA) cream Apply topically as needed.  30 g  6  . lidocaine-prilocaine (EMLA) cream Apply topically as needed.  30 g  0  . LORazepam (ATIVAN) 0.5 MG tablet Take 1 tablet (0.5 mg total) by mouth every 6 (six) hours as needed (Nausea or vomiting).  30 tablet  0  . LORazepam (ATIVAN) 1 MG tablet Take 1 tablet (1 mg total) by mouth 3 (three) times daily as needed for anxiety.  15 tablet  0  . metoprolol tartrate (LOPRESSOR) 25 MG tablet Take 25 mg by mouth 2 (two) times daily.      . montelukast (SINGULAIR) 10 MG tablet Take 10 mg by mouth at bedtime.      . ondansetron (ZOFRAN ODT) 4 MG disintegrating tablet Take 1 tablet (4 mg total) by mouth every 8 (eight) hours as needed for nausea.  10 tablet  0  . ondansetron (ZOFRAN) 8 MG tablet Take 1 tablet (8 mg total) by mouth 2 (two) times daily. Take two times a day starting  the day after chemo for 3 days. Then take two times a day as needed for nausea or vomiting.  30 tablet  1  . oxycodone (OXY-IR) 5 MG capsule Take 1 capsule (5 mg total) by mouth every 6 (six) hours as needed.  90 capsule  0  . prochlorperazine (COMPAZINE) 10 MG tablet Take 1 tablet (10 mg total) by mouth every 6 (six) hours as needed (Nausea or vomiting).  30 tablet  1  . prochlorperazine (COMPAZINE) 25 MG suppository Place 1 suppository (25 mg total) rectally every 12 (twelve) hours as needed for nausea.  12 suppository  3  . Prochlorperazine Maleate (COMPAZINE PO) Take by mouth.      . Promethazine HCl (PHENERGAN PO) Take by mouth.      . pyridOXINE (VITAMIN B-6) 100 MG tablet Take 200 mg by mouth daily.       No current facility-administered medications for this visit.    Allergies:    Allergies  Allergen Reactions  . Sulfa Antibiotics Rash    Starts with muscle pains "feels like she has been hit with bat"     Social History:  The patient  reports that she has never smoked. She has never used smokeless tobacco. She reports that she does not drink alcohol or use illicit drugs.   Family History:  The patient's family history includes Hypertension in her father; Thyroid cancer in her mother.   ROS:  Please see the history of present illness.      All other systems reviewed and negative.   PHYSICAL EXAM: VS:  There were no vitals taken for this visit. Well nourished, well developed, in no acute distress HEENT: normal Neck: no JVD Cardiac:  normal S1, S2; RRR; no murmur Lungs:  clear to auscultation bilaterally, no wheezing, rhonchi or rales Abd: soft, nontender, no hepatomegaly Ext: no edema Skin: warm and dry Neuro:  CNs 2-12 intact, no focal abnormalities noted       ASSESSMENT AND PLAN:  1. HTN - well controlled  - continue Lotrel and lopressor 2. Diastolic dysfunction  - continue beta blocker and ACE I 3. Dilated aortic root  - repeat echo in 1 year 4.  Atypical CP with SOB.  I suspect her SOB is due to sedentary lifestyle and chemo but he has heart disease in her family history and she wants to start an exercise program so I will get an ETT myoview to assess.  Followup with me in 6 months  Signed, Armanda Magic, MD 12/03/2012 4:27 PM

## 2012-12-11 ENCOUNTER — Other Ambulatory Visit (HOSPITAL_COMMUNITY): Payer: Self-pay | Admitting: Radiology

## 2012-12-11 ENCOUNTER — Ambulatory Visit (HOSPITAL_COMMUNITY): Payer: BC Managed Care – PPO | Attending: Cardiology | Admitting: Radiology

## 2012-12-11 VITALS — BP 137/102 | HR 91 | Ht 70.0 in | Wt 178.0 lb

## 2012-12-11 DIAGNOSIS — R5381 Other malaise: Secondary | ICD-10-CM | POA: Insufficient documentation

## 2012-12-11 DIAGNOSIS — R0609 Other forms of dyspnea: Secondary | ICD-10-CM | POA: Insufficient documentation

## 2012-12-11 DIAGNOSIS — R0602 Shortness of breath: Secondary | ICD-10-CM | POA: Insufficient documentation

## 2012-12-11 DIAGNOSIS — R079 Chest pain, unspecified: Secondary | ICD-10-CM | POA: Insufficient documentation

## 2012-12-11 DIAGNOSIS — R0989 Other specified symptoms and signs involving the circulatory and respiratory systems: Secondary | ICD-10-CM | POA: Insufficient documentation

## 2012-12-11 MED ORDER — TECHNETIUM TC 99M SESTAMIBI GENERIC - CARDIOLITE
10.0000 | Freq: Once | INTRAVENOUS | Status: AC | PRN
  Administered 2012-12-11: 10 via INTRAVENOUS

## 2012-12-11 MED ORDER — TECHNETIUM TC 99M SESTAMIBI GENERIC - CARDIOLITE
30.0000 | Freq: Once | INTRAVENOUS | Status: AC | PRN
  Administered 2012-12-11: 30 via INTRAVENOUS

## 2012-12-11 NOTE — Progress Notes (Signed)
[facility] SITE 3 NUCLEAR MED [address] [place], [place] [address] [phone]    Cardiology Nuclear Med Study  Amerah Puleo Downie is a 52 y.o. female     MRN : [id number]     DOB: [birth year]  Procedure Date: 12/11/2012  Nuclear Med Background Indication for Stress Test:  Evaluation for Ischemia History:  Asthma, Echo 2014 EF 50-55% Cardiac Risk Factors: Family History - CAD and Hypertension  Symptoms:  Chest Pain (last date of chest discomfort was 12/05/2012), DOE, Fatigue with Exertion, Rapid HR and SOB   Nuclear Pre-Procedure Caffeine/Decaff Intake:  None NPO After: 5:15 am   Lungs:  clear O2 Sat: 99% on room air. IV 0.9% NS with Angio Cath:  22g  IV Site: R Antecubital  IV Started by:  Dario Guardian, CNMT  Chest Size (in):  36 Cup Size: C  Height: 5\' 10"  (1.778 m)  Weight:  178 lb (80.74 kg)  BMI:  Body mass index is 25.54 kg/(m^2). Tech Comments:  Held Lopressor per instructions.    Nuclear Med Study 1 or 2 day study: 1 day  Stress Test Type:  Stress  Reading MD: Lance Muss, MD Order Authorizing Provider:  Armanda Magic, MD  Resting Radionuclide: Technetium 70m Sestamibi  Resting Radionuclide Dose: 11.0 mCi   Stress Radionuclide:  Technetium 60m Sestamibi  Stress Radionuclide Dose: 33.0 mCi           Stress Protocol Rest HR: 91 Stress HR: 160  Rest BP: 137/102 Stress BP: 158/85  Exercise Time (min): 6:00 METS: 4.6   Predicted Max HR: 170 bpm % Max HR: 94.12 bpm Rate Pressure Product: 21308   Dose of Adenosine (mg):  n/a Dose of Lexiscan: n/a mg  Dose of Atropine (mg): n/a Dose of Dobutamine: n/a mcg/kg/min (at max HR)  Stress Test Technologist: Nelson Chimes, BS-ES  Nuclear Technologist:  Dario Guardian, CNMT     Rest Procedure:  Myocardial perfusion imaging was performed at rest 45 minutes following the intravenous administration of Technetium 6m Sestamibi. Rest ECG: NSR - Normal EKG  Stress Procedure:  The patient exercised on the treadmill  utilizing the Bruce Protocol for 6:00 minutes. The patient stopped due to fatigue, SOB and denied any chest pain.  Technetium 46m Sestamibi was injected at peak exercise and myocardial perfusion imaging was performed after a brief delay. Patients symptoms resolved with recovery. Stress ECG: No significant change from baseline ECG  QPS Raw Data Images:  Mild breast attenuation.  Normal left ventricular size. Stress Images:  There is decreased uptake in the inferior wall. Rest Images:  There is decreased uptake in the apex. There is decreased uptake in the inferior wall.  Subtraction (SDS):  No evidence of ischemia. Transient Ischemic Dilatation (Normal <1.22):  0.83 Lung/Heart Ratio (Normal <0.45):  0.33  Quantitative Gated Spect Images QGS EDV:  67 ml QGS ESV:  20 ml  Impression Exercise Capacity:  Poor exercise capacity. BP Response:  Normal blood pressure response. Clinical Symptoms:  There is dyspnea. ECG Impression:  No significant ST segment change suggestive of ischemia. Comparison with Prior Nuclear Study: No images to compare  Overall Impression:  Low risk stress nuclear study .  No clear evidence of ischemia.  LV Ejection Fraction: 70%.  LV Wall Motion:  Normal Wall Motion  Sanaiya Welliver S.,MD, FACC   .

## 2012-12-14 NOTE — Addendum Note (Signed)
Addended by: Domenic Polite on: 12/14/2012 09:01 AM   Modules accepted: Orders

## 2012-12-16 ENCOUNTER — Encounter: Payer: Self-pay | Admitting: Cardiology

## 2012-12-17 ENCOUNTER — Telehealth: Payer: Self-pay | Admitting: Cardiology

## 2012-12-17 NOTE — Telephone Encounter (Signed)
New message ° ° ° ° °Want stress test results °

## 2012-12-18 NOTE — Telephone Encounter (Signed)
Pt is aware stress test was normal

## 2012-12-24 ENCOUNTER — Other Ambulatory Visit: Payer: Self-pay

## 2013-02-25 ENCOUNTER — Encounter: Payer: Self-pay | Admitting: Gynecologic Oncology

## 2013-02-25 ENCOUNTER — Ambulatory Visit: Payer: BC Managed Care – PPO | Attending: Gynecologic Oncology | Admitting: Gynecologic Oncology

## 2013-02-25 ENCOUNTER — Ambulatory Visit (HOSPITAL_BASED_OUTPATIENT_CLINIC_OR_DEPARTMENT_OTHER): Payer: BC Managed Care – PPO

## 2013-02-25 VITALS — BP 137/89 | HR 77 | Temp 97.6°F | Resp 16 | Ht 69.84 in | Wt 186.8 lb

## 2013-02-25 DIAGNOSIS — I10 Essential (primary) hypertension: Secondary | ICD-10-CM | POA: Insufficient documentation

## 2013-02-25 DIAGNOSIS — C569 Malignant neoplasm of unspecified ovary: Secondary | ICD-10-CM | POA: Insufficient documentation

## 2013-02-25 DIAGNOSIS — F411 Generalized anxiety disorder: Secondary | ICD-10-CM | POA: Insufficient documentation

## 2013-02-25 DIAGNOSIS — Z90710 Acquired absence of both cervix and uterus: Secondary | ICD-10-CM | POA: Insufficient documentation

## 2013-02-25 DIAGNOSIS — R1909 Other intra-abdominal and pelvic swelling, mass and lump: Secondary | ICD-10-CM | POA: Insufficient documentation

## 2013-02-25 DIAGNOSIS — M949 Disorder of cartilage, unspecified: Secondary | ICD-10-CM

## 2013-02-25 DIAGNOSIS — R609 Edema, unspecified: Secondary | ICD-10-CM | POA: Insufficient documentation

## 2013-02-25 DIAGNOSIS — R143 Flatulence: Secondary | ICD-10-CM

## 2013-02-25 DIAGNOSIS — J45909 Unspecified asthma, uncomplicated: Secondary | ICD-10-CM | POA: Insufficient documentation

## 2013-02-25 DIAGNOSIS — K829 Disease of gallbladder, unspecified: Secondary | ICD-10-CM | POA: Insufficient documentation

## 2013-02-25 DIAGNOSIS — Z808 Family history of malignant neoplasm of other organs or systems: Secondary | ICD-10-CM | POA: Insufficient documentation

## 2013-02-25 DIAGNOSIS — Z9079 Acquired absence of other genital organ(s): Secondary | ICD-10-CM | POA: Insufficient documentation

## 2013-02-25 DIAGNOSIS — Z9221 Personal history of antineoplastic chemotherapy: Secondary | ICD-10-CM | POA: Insufficient documentation

## 2013-02-25 DIAGNOSIS — M899 Disorder of bone, unspecified: Secondary | ICD-10-CM | POA: Insufficient documentation

## 2013-02-25 DIAGNOSIS — R141 Gas pain: Secondary | ICD-10-CM | POA: Insufficient documentation

## 2013-02-25 DIAGNOSIS — Z85820 Personal history of malignant melanoma of skin: Secondary | ICD-10-CM | POA: Insufficient documentation

## 2013-02-25 DIAGNOSIS — R5381 Other malaise: Secondary | ICD-10-CM | POA: Insufficient documentation

## 2013-02-25 DIAGNOSIS — Z95828 Presence of other vascular implants and grafts: Secondary | ICD-10-CM

## 2013-02-25 DIAGNOSIS — R142 Eructation: Secondary | ICD-10-CM | POA: Insufficient documentation

## 2013-02-25 DIAGNOSIS — Z452 Encounter for adjustment and management of vascular access device: Secondary | ICD-10-CM

## 2013-02-25 DIAGNOSIS — R109 Unspecified abdominal pain: Secondary | ICD-10-CM | POA: Insufficient documentation

## 2013-02-25 DIAGNOSIS — K7689 Other specified diseases of liver: Secondary | ICD-10-CM | POA: Insufficient documentation

## 2013-02-25 DIAGNOSIS — R5383 Other fatigue: Secondary | ICD-10-CM

## 2013-02-25 MED ORDER — HEPARIN SOD (PORK) LOCK FLUSH 100 UNIT/ML IV SOLN
500.0000 [IU] | Freq: Once | INTRAVENOUS | Status: AC
  Administered 2013-02-25: 500 [IU] via INTRAVENOUS
  Filled 2013-02-25: qty 5

## 2013-02-25 MED ORDER — SODIUM CHLORIDE 0.9 % IJ SOLN
10.0000 mL | INTRAMUSCULAR | Status: DC | PRN
  Administered 2013-02-25: 10 mL via INTRAVENOUS
  Filled 2013-02-25: qty 10

## 2013-02-25 NOTE — Progress Notes (Signed)
GYNECOLOGIC ONCOLOGY OFFICE VISIT  CHIEF COMPLAINT: Ovarian cancer surveillance  ASSESSMENT/PLAN:  53 y.o.  with Stage IIB carcinosarcoma of the ovary Repeat PET at [facility] in 3 months F/U in 3 months  History of Present Illness: Morgan Olsen is a 53 y.o. woman who initially presented to the ER with pelvic pain. She states she first noticed this pain briefly in 2012, but it was self limited, so she never sought care for it. Since then, she has had occasional episodes of debilitating pelvic pain that usually go away after a few minutes. However, during her most recent episode, she developed acute onset severe lower abdominal/pelvic pain accompanied by cold sweats and nausea. The pain didn't resolve spontaneously so she ultimately went to the ER for evaluation. A CT scan in the ED showed an 11cm complex left ovarian mass with mild mesenteric/omental stranding, small ascites, and peritoneal nodularity. She was referred to Dr. Gertie Fey, who performed a TVUS confirming an 11.5 cm complex left ovarian mass. No free fluid was seen and the uterus was otherwise normal. Her CA-125 was checked and elevated at 1600.Oncology Summary:  07/15/2012 Surgery TAH, BSO, ovarian cancer staging, appendectomy optimal debulking no gross residual disease  Intraoperative Findings: 10 cm Left ovary with cyst containing old blood and excrescences. The ovarian cyst was already ruptured upon entry to the abdomen. Adenocarcinoma of the ovary on frozen section: cannot rule out non-gyn primary. Left ovarian mass adherent to the uterine fundus and the sigmoid colon mesentery. Left ureter dilated to 1 cm prior to mass removal. Friable, dark soft tissue implants throughout the posterior cul-de-sac and adherent to the appendix. Bilateral pelvic adenopathy, largest 1 cm. Normal-appearing right tube and ovary. No masses noted on the small or large bowel, the mesentery, the liver edge, or the diaphragm. At the end of the case there was no residual  disease.  Pathology: Ovary, left, oophorectomy - Carcinosarcoma, homologous type, consisting of mixed endometrioid, clear cell, mucinous, and squamous cell carcinoma components and homologous sarcomatous component.  - No heterologous elements identified  - Tumor necrosis and acute hemorrhage present  - Tumor size 10.5 x 8 x 8.5 cm  - No ovarian surface involvement identified  - No lymphovascular invasion identified  Left fallopian tube - Serosal fibrovascular adhesions, mild chronic salpingitis, no tumor seen Extent of involvement of other organs tissues: Tumor involves left ovary and "periureteral nodule" only  Immunostains are performed on a representative block of the ovarian tumor showing the sarcomatous component (A18). Desmin and smooth muscle actin show subset positive staining within the sarcomatous component. Vimentin stains both components strongly and diffusely positive. Inhibin is negative. Calretinin shows focal subset positive staining. Pancytokeratin (AE1/AE3) stains the epithelial component strongly positive and is negative within the sarcomatous component. These findings support the presence of a homologous sarcomatous component and the diagnosis of carcinosarcoma which is retained.  INTERVAL HISTORY: Received 4 cyles Taxol/Carboplatin and then presented with c/o being unable to grasp and hold objects. Chemotherapy was then changed to docetaxol/Carbo.  Last cycle of taxane/platin administered 11/2012  CT 11/2012 IMPRESSION: --New round hypodense lesions along the right pelvic sidewall and adjacent to the left proximal common iliac vessels measuring fluid density are likely lymphoceles, seromas or hematomas.  --Indeterminate subcentimeter hyperattenuating lesion in segment V of the liver, too small to characterize, unchanged. -- Focal narrowing of the gallbladder body with focal thickening along the gallbladder fundus, similar to prior. Question adenomyomatosis, however a  gallbladder mass cannot be excluded. Consider gallbladder ultrasound as  clinically indicated.  PET 12/2012 GU Tract: Sequela of prior TAH/BSO with regional lymphadenectomy is again noted. Cystic lesions are again noted adjacent to the left common iliac artery (series 4, image 208), and the right pelvic side wall (series 4, image 235), which are stable to slightly decreased in size, and do not demonstrate abnormal FDG uptake. No new soft tissue lesions or abnormal FDG uptake is noted in the resection bed or associated with the residual vaginal fornices. Mild FDG uptake is associated with the inferior aspect of the midline surgical incision, with stable soft tissue stranding, likely representing scar tissue. Adenopathy: None  MUSCULOSKELETAL: Sclerotic lesions are again noted in the L4 vertebral body, right iliac bone, and right sacrum, which are unchanged and demonstrate no elevated FDG uptake, consistent with benign bone islands. No suspicious metabolically active osseous lesions are identified.  No foci of abnormal FDG uptake are noted involving the external soft tissues.  IMPRESSION: -No metabolically active disease. -Mild decrease in size of previously noted cystic lesions in the right pelvic sidewall and adjacent to the left common iliac lymphadenectomy site, consistent with seroma or lymphocele. -Gallbladder stones/sludge, without evidence of cholecystitis or metabolically active gallbladder mass.  Patient feels well. Fatigue is slowly resolving. Reports intermittent episodes of mild abdominal pain and bloating.   Past Medical History: . Hypertension  . Endometriosis  Identified at the time of cesarean section  . Asthma  . Melanoma in situ s/p excision  . Anxiety  . Ovarian cancer 06/2012    Past Surgical History  . Wisdom tooth extraction 1980s  . Tonsillectomy and adenoidectomy 1980s  . Cesarean section 1998  . Endometrial ablation 2011 For menorrhagia  RESECTION(INITIAL)  OVARIAN/TUBAL/PRIMA PERTONL MALIG W/BIL S&O, OMNTECT; W/TAH/LTD PARA-AORTIC LYMPHADENECT: APPENDECTOMY; . Abdominal hysterectomy 07/15/2012  .COLONOSCOPY, FLEXIBLE, PROXIMAL TO SPLENIC FLEXURE; DIAGNOSTIC, W/WO COLLECTION SPECIMEN BY BRUSH OR [place];  07/17/2012  . Melanoma insitu resection Left 2007 resected from underside of left forearm    Family History  . Colon cancer Other 38  . Uterine cancer Other 54  . Colon cancer Father 36 one polyp  . Cancer Mother Thyroid  . Asthma Mother  . Ovarian cancer Other 44  . Stroke Paternal Grandmother   REVIEW OF SYSTEMS Constitutional  Feels well, more energy, alopecia has resolved. Cardiovascular  No chest pain, shortness of breath, or edema  Pulmonary  No cough or wheeze.  Gastro Intestinal  No nausea, vomitting, or diarrhoea. No bright red blood per rectum, intermittent mild abdominal discomfort with bloating, no change in bowel movement, or constipation.  Genito Urinary  No frequency, urgency, dysuria, mild residual neuropathy Musculo Skeletal  No myalgia, arthralgia, joint swelling or pain  Neurologic  No weakness, numbness, change in gait,  Psychology  No depression, anxiety, insomnia.    PHYSICAL EXAMINATION: BP 137/89  Pulse 77  Temp(Src) 97.6 F (36.4 C)  Resp 16  Ht 5' 9.84" (1.774 m)  Wt 186 lb 12.8 oz (84.732 kg)  BMI 26.92 kg/m2  WD female in NAD HEENT: Normal ROM, Sclera non icteric CHEST:  CTA ABD:  Soft NT, Incision C/D/I no masses, no fluid wave, non tender PELVIC:  Nl EGBUS, no vaginal bleeding or discharge EXT: 1+ edema bilaterally NEURO. Normal gait Able to ambulate without difficulty BACK:  No CVAT LN:  No cervical supraclavicular or inguinal adenopathy

## 2013-02-25 NOTE — Patient Instructions (Signed)
Implanted Port Instructions  An implanted port is a central line that has a round shape and is placed under the skin. It is used for long-term IV (intravenous) access for:  · Medicine.  · Fluids.  · Liquid nutrition, such as TPN (total parenteral nutrition).  · Blood samples.  Ports can be placed:  · In the chest area just below the collarbone (this is the most common place.)  · In the arms.  · In the belly (abdomen) area.  · In the legs.  PARTS OF THE PORT  A port has 2 main parts:  · The reservoir. The reservoir is round, disc-shaped, and will be a small, raised area under your skin.  · The reservoir is the part where a needle is inserted (accessed) to either give medicines or to draw blood.  · The catheter. The catheter is a long, slender tube that extends from the reservoir. The catheter is placed into a large vein.  · Medicine that is inserted into the reservoir goes into the catheter and then into the vein.  INSERTION OF THE PORT  · The port is surgically placed in either an operating room or in a procedural area (interventional radiology).  · Medicine may be given to help you relax during the procedure.  · The skin where the port will be inserted is numbed (local anesthetic).  · 1 or 2 small cuts (incisions) will be made in the skin to insert the port.  · The port can be used after it has been inserted.  INCISION SITE CARE  · The incision site may have small adhesive strips on it. This helps keep the incision site closed. Sometimes, no adhesive strips are placed. Instead of adhesive strips, a special kind of surgical glue is used to keep the incision closed.  · If adhesive strips were placed on the incision sites, do not take them off. They will fall off on their own.  · The incision site may be sore for 1 to 2 days. Pain medicine can help.  · Do not get the incision site wet. Bathe or shower as directed by your caregiver.  · The incision site should heal in 5 to 7 days. A small scar may form after the  incision has healed.  ACCESSING THE PORT  Special steps must be taken to access the port:  · Before the port is accessed, a numbing cream can be placed on the skin. This helps numb the skin over the port site.  · A sterile technique is used to access the port.  · The port is accessed with a needle. Only "non-coring" port needles should be used to access the port. Once the port is accessed, a blood return should be checked. This helps ensure the port is in the vein and is not clogged (clotted).  · If your caregiver believes your port should remain accessed, a clear (transparent) bandage will be placed over the needle site. The bandage and needle will need to be changed every week or as directed by your caregiver.  · Keep the bandage covering the needle clean and dry. Do not get it wet. Follow your caregiver's instructions on how to take a shower or bath when the port is accessed.  · If your port does not need to stay accessed, no bandage is needed over the port.  FLUSHING THE PORT  Flushing the port keeps it from getting clogged. How often the port is flushed depends on:  · If a   constant infusion is running. If a constant infusion is running, the port may not need to be flushed.  · If intermittent medicines are given.  · If the port is not being used.  For intermittent medicines:  · The port will need to be flushed:  · After medicines have been given.  · After blood has been drawn.  · As part of routine maintenance.  · A port is normally flushed with:  · Normal saline.  · Heparin.  · Follow your caregiver's advice on how often, how much, and the type of flush to use on your port.  IMPORTANT PORT INFORMATION  · Tell your caregiver if you are allergic to heparin.  · After your port is placed, you will get a manufacturer's information card. The card has information about your port. Keep this card with you at all times.  · There are many types of ports available. Know what kind of port you have.  · In case of an  emergency, it may be helpful to wear a medical alert bracelet. This can help alert health care workers that you have a port.  · The port can stay in for as long as your caregiver believes it is necessary.  · When it is time for the port to come out, surgery will be done to remove it. The surgery will be similar to how the port was put in.  · If you are in the hospital or clinic:  · Your port will be taken care of and flushed by a nurse.  · If you are at home:  · A home health care nurse may give medicines and take care of the port.  · You or a family member can get special training and directions for giving medicine and taking care of the port at home.  SEEK IMMEDIATE MEDICAL CARE IF:   · Your port does not flush or you are unable to get a blood return.  · New drainage or pus is coming from the incision.  · A bad smell is coming from the incision site.  · You develop swelling or increased redness at the incision site.  · You develop increased swelling or pain at the port site.  · You develop swelling or pain in the surrounding skin near the port.  · You have an oral temperature above 102° F (38.9° C), not controlled by medicine.  MAKE SURE YOU:   · Understand these instructions.  · Will watch your condition.  · Will get help right away if you are not doing well or get worse.  Document Released: 02/04/2005 Document Revised: 04/29/2011 Document Reviewed: 04/28/2008  [employer]® Patient Information ©2014 [employer].

## 2013-02-25 NOTE — Patient Instructions (Signed)
F/U in 3 months PET at [facility] in 3 months  [employer]

## 2013-06-09 ENCOUNTER — Telehealth: Payer: Self-pay | Admitting: Gynecologic Oncology

## 2013-06-09 NOTE — Telephone Encounter (Signed)
GYNECOLOGIC ONCOLOGY OFFICE VISIT  CHIEF COMPLAINT: Ovarian cancer surveillance  ASSESSMENT/PLAN:  53 y.o.  with Stage IIB carcinosarcoma of the ovary  F/U in 3 months  History of Present Illness: Morgan Olsen is a 53 y.o. woman who initially presented to the ER with pelvic pain.   A CT scan in the ED showed an 11cm complex left ovarian mass with mild mesenteric/omental stranding, small ascites, and peritoneal nodularity. She was referred to Dr. Gertie Fey, who performed a TVUS confirming an 11.5 cm complex left ovarian mass. No free fluid was seen and the uterus was otherwise normal. Her CA-125 was elevated at 1600.Oncology Summary:  07/15/2012 Surgery TAH, BSO, ovarian cancer staging, appendectomy optimal debulking no gross residual disease  Intraoperative Findings: 10 cm Left ovary with cyst containing old blood and excrescences. The ovarian cyst was already ruptured upon entry to the abdomen. Adenocarcinoma of the ovary on frozen section: cannot rule out non-gyn primary. Left ovarian mass adherent to the uterine fundus and the sigmoid colon mesentery. Left ureter dilated to 1 cm prior to mass removal. Friable, dark soft tissue implants throughout the posterior cul-de-sac and adherent to the appendix. Bilateral pelvic adenopathy, largest 1 cm. Normal-appearing right tube and ovary. No masses noted on the small or large bowel, the mesentery, the liver edge, or the diaphragm. At the end of the case there was no residual disease.  Pathology: Ovary, left, oophorectomy - Carcinosarcoma, homologous type, consisting of mixed endometrioid, clear cell, mucinous, and squamous cell carcinoma components and homologous sarcomatous component.  - No heterologous elements identified  - Tumor necrosis and acute hemorrhage present  - Tumor size 10.5 x 8 x 8.5 cm  - No ovarian surface involvement identified  - No lymphovascular invasion identified  Left fallopian tube - Serosal fibrovascular adhesions, mild chronic  salpingitis, no tumor seen Extent of involvement of other organs tissues: Tumor involves left ovary and "periureteral nodule" only  Immunostains are performed on a representative block of the ovarian tumor showing the sarcomatous component (A18). Desmin and smooth muscle actin show subset positive staining within the sarcomatous component. Vimentin stains both components strongly and diffusely positive. Inhibin is negative. Calretinin shows focal subset positive staining. Pancytokeratin (AE1/AE3) stains the epithelial component strongly positive and is negative within the sarcomatous component. These findings support the presence of a homologous sarcomatous component and the diagnosis of carcinosarcoma which is retained.  INTERVAL HISTORY: Received 4 cyles Taxol/Carboplatin and then presented with c/o being unable to grasp and hold objects. Chemotherapy was then changed to docetaxol/Carbo.  Last cycle of taxane/platin administered 11/2012  CT 11/2012 IMPRESSION: --New round hypodense lesions along the right pelvic sidewall and adjacent to the left proximal common iliac vessels measuring fluid density are likely lymphoceles, seromas or hematomas.  --Indeterminate subcentimeter hyperattenuating lesion in segment V of the liver, too small to characterize, unchanged. -- Focal narrowing of the gallbladder body with focal thickening along the gallbladder fundus, similar to prior. Question adenomyomatosis, however a gallbladder mass cannot be excluded. Consider gallbladder ultrasound as clinically indicated.  PET 12/2012 GU Tract: Sequela of prior TAH/BSO with regional lymphadenectomy is again noted. Cystic lesions are again noted adjacent to the left common iliac artery (series 4, image 208), and the right pelvic side wall (series 4, image 235), which are stable to slightly decreased in size, and do not demonstrate abnormal FDG uptake. No new soft tissue lesions or abnormal FDG uptake is noted in the  resection bed or associated with the residual vaginal fornices. Mild  FDG uptake is associated with the inferior aspect of the midline surgical incision, with stable soft tissue stranding, likely representing scar tissue. Adenopathy: None  MUSCULOSKELETAL: Sclerotic lesions are again noted in the L4 vertebral body, right iliac bone, and right sacrum, which are unchanged and demonstrate no elevated FDG uptake, consistent with benign bone islands. No suspicious metabolically active osseous lesions are identified.  No foci of abnormal FDG uptake are noted involving the external soft tissues.  IMPRESSION: -No metabolically active disease. -Mild decrease in size of previously noted cystic lesions in the right pelvic sidewall and adjacent to the left common iliac lymphadenectomy site, consistent with seroma or lymphocele. -Gallbladder stones/sludge, without evidence of cholecystitis or metabolically active gallbladder mass.  Repeat PET 05/2013 No evidence of recurrent disease.    Past Medical History: . Hypertension  . Endometriosis  Identified at the time of cesarean section  . Asthma  . Melanoma in situ s/p excision  . Anxiety  . Ovarian cancer 06/2012    Past Surgical History  . Wisdom tooth extraction 1980s  . Tonsillectomy and adenoidectomy 1980s  . Cesarean section 1998  . Endometrial ablation 2011 For menorrhagia  RESECTION(INITIAL) OVARIAN/TUBAL/PRIMA PERTONL MALIG W/BIL S&O, OMNTECT; W/TAH/LTD PARA-AORTIC LYMPHADENECT: APPENDECTOMY; . Abdominal hysterectomy 07/15/2012  .COLONOSCOPY, FLEXIBLE, PROXIMAL TO SPLENIC FLEXURE; DIAGNOSTIC, W/WO COLLECTION SPECIMEN BY BRUSH OR [place];  07/17/2012  . Melanoma insitu resection Left 2007 resected from underside of left forearm    Family History  . Colon cancer Other 38  . Uterine cancer Other 54  . Colon cancer Father 51 one polyp  . Cancer Mother Thyroid  . Asthma Mother  . Ovarian cancer Other 11  . Stroke Paternal Grandmother   REVIEW  OF SYSTEMS Constitutional  Feels well, more energy, alopecia has resolved. Cardiovascular  No chest pain, shortness of breath, or edema  Pulmonary  No cough or wheeze.  Gastro Intestinal  No nausea, vomitting, or diarrhoea. No bright red blood per rectum, intermittent mild abdominal discomfort with bloating, no change in bowel movement, or constipation.  Genito Urinary  No frequency, urgency, dysuria, mild residual neuropathy Musculo Skeletal  No myalgia, arthralgia, joint swelling or pain  Neurologic  No weakness, numbness, change in gait,  Psychology  No depression, anxiety, insomnia.    PHYSICAL EXAMINATION: BP 137/89  Pulse 77  Temp(Src) 97.6 F (36.4 C)  Resp 16  Ht 5' 9.84" (1.774 m)  Wt 186 lb 12.8 oz (84.732 kg)  BMI 26.92 kg/m2  WD female in NAD HEENT: Normal ROM, Sclera non icteric CHEST:  CTA ABD:  Soft NT, Incision C/D/I no masses, no fluid wave, non tender PELVIC:  Nl EGBUS, no vaginal bleeding or discharge EXT: 1+ edema bilaterally NEURO. Normal gait Able to ambulate without difficulty BACK:  No CVAT LN:  No cervical supraclavicular or inguinal adenopathy

## 2013-06-10 ENCOUNTER — Ambulatory Visit (HOSPITAL_BASED_OUTPATIENT_CLINIC_OR_DEPARTMENT_OTHER): Payer: BC Managed Care – PPO

## 2013-06-10 ENCOUNTER — Ambulatory Visit: Payer: BC Managed Care – PPO | Attending: Gynecologic Oncology | Admitting: Gynecologic Oncology

## 2013-06-10 ENCOUNTER — Encounter: Payer: Self-pay | Admitting: Gynecologic Oncology

## 2013-06-10 VITALS — BP 136/89 | HR 72 | Temp 98.5°F | Resp 18 | Ht 69.84 in | Wt 192.0 lb

## 2013-06-10 DIAGNOSIS — C569 Malignant neoplasm of unspecified ovary: Secondary | ICD-10-CM

## 2013-06-10 DIAGNOSIS — Z95828 Presence of other vascular implants and grafts: Secondary | ICD-10-CM

## 2013-06-10 DIAGNOSIS — Z452 Encounter for adjustment and management of vascular access device: Secondary | ICD-10-CM

## 2013-06-10 MED ORDER — SODIUM CHLORIDE 0.9 % IJ SOLN
10.0000 mL | INTRAMUSCULAR | Status: DC | PRN
  Administered 2013-06-10: 10 mL via INTRAVENOUS
  Filled 2013-06-10: qty 10

## 2013-06-10 MED ORDER — HEPARIN SOD (PORK) LOCK FLUSH 100 UNIT/ML IV SOLN
500.0000 [IU] | Freq: Once | INTRAVENOUS | Status: AC
  Administered 2013-06-10: 500 [IU] via INTRAVENOUS
  Filled 2013-06-10: qty 5

## 2013-06-10 NOTE — Patient Instructions (Signed)

## 2013-06-10 NOTE — Patient Instructions (Signed)
Follow-up in 3 months [place]  Wt Readings from Last 3 Encounters:  06/10/13 192 lb (87.091 kg)  02/25/13 186 lb 12.8 oz (84.732 kg)  12/11/12 178 lb (80.74 kg)  Body mass index is 27.67 kg/(m^2).   Calorie Counting Diet A calorie counting diet requires you to eat the number of calories that are right for you in a day. Calories are the measurement of how much energy you get from the food you eat. Eating the right amount of calories is important for staying at a healthy weight. If you eat too many calories, your body will store them as fat and you may gain weight. If you eat too few calories, you may lose weight. Counting the number of calories you eat during a day will help you know if you are eating the right amount. A Registered Dietitian can determine how many calories you need in a day. The amount of calories needed varies from person to person. If your goal is to lose weight, you will need to eat fewer calories. Losing weight can benefit you if you are overweight or have health problems such as heart disease, high blood pressure, or diabetes. If your goal is to gain weight, you will need to eat more calories. Gaining weight may be necessary if you have a certain health problem that causes your body to need more energy. TIPS Whether you are increasing or decreasing the number of calories you eat during a day, it may be hard to get used to changes in what you eat and drink. The following are tips to help you keep track of the number of calories you eat.  Measure foods at home with measuring cups. This helps you know the amount of food and number of calories you are eating.  Restaurants often serve food in amounts that are larger than 1 serving. While eating out, estimate how many servings of a food you are given. For example, a serving of cooked rice is  cup or about the size of half of a fist. Knowing serving sizes will help you be aware of how much food you are eating at  restaurants.  Ask for smaller portion sizes or child-size portions at restaurants.  Plan to eat half of a meal at a restaurant. Take the rest home or share the other half with a friend.  Read the Nutrition Facts panel on food labels for calorie content and serving size. You can find out how many servings are in a package, the size of a serving, and the number of calories each serving has.  For example, a package might contain 3 cookies. The Nutrition Facts panel on that package says that 1 serving is 1 cookie. Below that, it will say there are 3 servings in the container. The calories section of the Nutrition Facts label says there are 90 calories. This means there are 90 calories in 1 cookie (1 serving). If you eat 1 cookie you have eaten 90 calories. If you eat all 3 cookies, you have eaten 270 calories (3 servings x 90 calories = 270 calories). The list below tells you how big or small some common portion sizes are.  1 oz.........4 stacked dice.  3 oz........Marland KitchenDeck of cards.  1 tsp.......Marland KitchenTip of little finger.  1 tbs......Marland KitchenMarland KitchenThumb.  2 tbs.......Marland KitchenGolf ball.   cup......Marland KitchenHalf of a fist.  1 cup.......Marland KitchenA fist. KEEP A FOOD LOG Write down every food item you eat, the amount you eat, and the number of calories in each food  you eat during the day. At the end of the day, you can add up the total number of calories you have eaten. It may help to keep a list like the one below. Find out the calorie information by reading the Nutrition Facts panel on food labels. Breakfast  Bran cereal (1 cup, 110 calories).  Fat-free milk ( cup, 45 calories). Snack  Apple (1 medium, 80 calories). Lunch  Spinach (1 cup, 20 calories).  Tomato ( medium, 20 calories).  Chicken breast strips (3 oz, 165 calories).  Shredded cheddar cheese ( cup, 110 calories).  Light [place] dressing (2 tbs, 60 calories).  Whole-wheat bread (1 slice, 80 calories).  Tub margarine (1 tsp, 35 calories).  Vegetable  soup (1 cup, 160 calories). Dinner  Pork chop (3 oz, 190 calories).  Brown rice (1 cup, 215 calories).  Steamed broccoli ( cup, 20 calories).  Strawberries (1  cup, 65 calories).  Whipped cream (1 tbs, 50 calories). Daily Calorie Total: 6010 Document Released: 02/04/2005 Document Revised: 04/29/2011 Document Reviewed: 08/01/2006 [facility] Patient Information 2014 [place].   Thank you very much Ms. Alla German for allowing me to provide care for you today.  I appreciate your confidence in choosing our Gynecologic Oncology team.  If you have any questions about your visit today please call our office and we will get back to you as soon as possible.  Francetta Found. Irbin Fines MD., PhD Gynecologic Oncology

## 2013-06-10 NOTE — Progress Notes (Signed)
GYNECOLOGIC ONCOLOGY OFFICE VISIT  CHIEF COMPLAINT: Ovarian cancer surveillance  ASSESSMENT/PLAN:  53 y.o.  with Stage IIB carcinosarcoma of the ovary No evidence of disease F/U in 3 months  Obesity: Activity is limited by Achilles tendon discomfort Will consider swimming to increase her activity  HISTORY OF PRESENT ILLNESS Morgan Olsen is a 53 y.o. woman who initially presented to the ER with pelvic pain.   A CT scan in the ED showed an 11cm complex left ovarian mass with mild mesenteric/omental stranding, small ascites, and peritoneal nodularity. She was referred to Dr. Gertie Fey, who performed a TVUS confirming an 11.5 cm complex left ovarian mass. No free fluid was seen and the uterus was otherwise normal. Her CA-125 was elevated at 1600.  Oncology Summary:  07/15/2012 Surgery TAH, BSO, ovarian cancer staging, appendectomy optimal debulking no gross residual disease  Intraoperative Findings: 10 cm Left ovary with cyst containing old blood and excrescences. The ovarian cyst was already ruptured upon entry to the abdomen. Adenocarcinoma of the ovary on frozen section: cannot rule out non-gyn primary. Left ovarian mass adherent to the uterine fundus and the sigmoid colon mesentery. Left ureter dilated to 1 cm prior to mass removal. Friable, dark soft tissue implants throughout the posterior cul-de-sac and adherent to the appendix. Bilateral pelvic adenopathy, largest 1 cm. Normal-appearing right tube and ovary. No masses noted on the small or large bowel, the mesentery, the liver edge, or the diaphragm. At the end of the case there was no residual disease.  Pathology: Ovary, left, oophorectomy - Carcinosarcoma, homologous type, consisting of mixed endometrioid, clear cell, mucinous, and squamous cell carcinoma components and homologous sarcomatous component.   Received 4 cyles Taxol/Carboplatin and then presented with c/o being unable to grasp and hold objects. Chemotherapy was then changed to  docetaxol/Carbo.  Last cycle of taxane/platin administered 11/2012  CT 11/2012 IMPRESSION: --New round hypodense lesions along the right pelvic sidewall and adjacent to the left proximal common iliac vessels measuring fluid density are likely lymphoceles, seromas or hematomas.  --Indeterminate subcentimeter hyperattenuating lesion in segment V of the liver, too small to characterize, unchanged. -- Focal narrowing of the gallbladder body with focal thickening along the gallbladder fundus, similar to prior. Question adenomyomatosis, however a gallbladder mass cannot be excluded. Consider gallbladder ultrasound as clinically indicated.  PET 12/2012 GU Tract: Sequela of prior TAH/BSO with regional lymphadenectomy is again noted. Cystic lesions are again noted adjacent to the left common iliac artery (series 4, image 208), and the right pelvic side wall (series 4, image 235), which are stable to slightly decreased in size, and do not demonstrate abnormal FDG uptake. No new soft tissue lesions or abnormal FDG uptake is noted in the resection bed or associated with the residual vaginal fornices. Mild FDG uptake is associated with the inferior aspect of the midline surgical incision, with stable soft tissue stranding, likely representing scar tissue. Adenopathy: None  MUSCULOSKELETAL: Sclerotic lesions are again noted in the L4 vertebral body, right iliac bone, and right sacrum, which are unchanged and demonstrate no elevated FDG uptake, consistent with benign bone islands. No suspicious metabolically active osseous lesions are identified.  No foci of abnormal FDG uptake are noted involving the external soft tissues.  IMPRESSION: -No metabolically active disease. -Mild decrease in size of previously noted cystic lesions in the right pelvic sidewall and adjacent to the left common iliac lymphadenectomy site, consistent with seroma or lymphocele. -Gallbladder stones/sludge, without evidence of cholecystitis  or metabolically active gallbladder mass.  Repeat PET  05/2013 No evidence of recurrent disease.  Doing well.  Reports weight gain.   Past Medical History: . Hypertension  . Endometriosis  Identified at the time of cesarean section  . Asthma  . Melanoma in situ s/p excision  . Anxiety  . Ovarian cancer 06/2012    Past Surgical History  . Wisdom tooth extraction 1980s  . Tonsillectomy and adenoidectomy 1980s  . Cesarean section 1998  . Endometrial ablation 2011 For menorrhagia  RESECTION(INITIAL) OVARIAN/TUBAL/PRIMA PERTONL MALIG W/BIL S&O, OMNTECT; W/TAH/LTD PARA-AORTIC LYMPHADENECT: APPENDECTOMY; . Abdominal hysterectomy 07/15/2012  .COLONOSCOPY, FLEXIBLE, PROXIMAL TO SPLENIC FLEXURE; DIAGNOSTIC, W/WO COLLECTION SPECIMEN BY BRUSH OR [place];  07/17/2012  . Melanoma insitu resection Left 2007 resected from underside of left forearm    Family History  . Colon cancer Other 43  . Uterine cancer Other 54  . Colon cancer Father 41 one polyp  . Cancer Mother Thyroid  . Asthma Mother  . Ovarian cancer Other 69  . Stroke Paternal Grandmother   REVIEW OF SYSTEMS Constitutional  Feels well, more energy, alopecia has resolved. Cardiovascular  No chest pain, shortness of breath, or edema  Pulmonary  No cough or wheeze.  Gastro Intestinal  No nausea, vomitting, or diarrhoea. No bright red blood per rectum, intermittent mild abdominal discomfort with bloating, no change in bowel movement, or constipation.  Genito Urinary  No frequency, urgency, dysuria, mild residual neuropathy Musculo Skeletal  No myalgia, arthralgia, joint swelling or pain  Neurologic  No weakness, numbness, reports change in gait because of achilles tendon issues, unable to exercise Psychology  No depression, anxiety, insomnia.    PHYSICAL EXAMINATION: BP 136/89  Pulse 72  Temp(Src) 98.5 F (36.9 C) (Oral)  Resp 18  Ht 5' 9.84" (1.774 m)  Wt 192 lb (87.091 kg)  BMI 27.67 kg/m2 Wt Readings from Last 3  Encounters:  06/10/13 192 lb (87.091 kg)  02/25/13 186 lb 12.8 oz (84.732 kg)  12/11/12 178 lb (80.74 kg)    WD female in NAD HEENT: Normal ROM, Sclera non icteric CHEST:  CTA ABD:  Soft NT, Incision C/D/I no masses, no fluid wave, non tender PELVIC:  Nl EGBUS, no vaginal bleeding or discharge EXT: 1+ edema bilaterally BACK:  No CVAT LN:  No cervical supraclavicular or inguinal adenopathy PELVIC:  Nl EGBUS, atrophic vagina, no masses, no cul de sac nodularity Rectal:  Good tone no masses, no nodularity

## 2013-06-27 ENCOUNTER — Encounter: Payer: Self-pay | Admitting: Gynecologic Oncology

## 2013-06-28 ENCOUNTER — Other Ambulatory Visit: Payer: Self-pay | Admitting: Gynecologic Oncology

## 2013-06-28 DIAGNOSIS — C569 Malignant neoplasm of unspecified ovary: Secondary | ICD-10-CM

## 2013-06-28 NOTE — Progress Notes (Signed)
Nutrition referral per patient.

## 2013-08-13 ENCOUNTER — Encounter: Payer: Self-pay | Admitting: Dietician

## 2013-08-13 ENCOUNTER — Encounter: Payer: BC Managed Care – PPO | Attending: Gynecologic Oncology | Admitting: Dietician

## 2013-08-13 VITALS — Ht 69.5 in | Wt 192.6 lb

## 2013-08-13 DIAGNOSIS — R635 Abnormal weight gain: Secondary | ICD-10-CM | POA: Insufficient documentation

## 2013-08-13 DIAGNOSIS — Z9221 Personal history of antineoplastic chemotherapy: Secondary | ICD-10-CM | POA: Insufficient documentation

## 2013-08-13 DIAGNOSIS — Z713 Dietary counseling and surveillance: Secondary | ICD-10-CM | POA: Diagnosis not present

## 2013-08-13 DIAGNOSIS — Z8543 Personal history of malignant neoplasm of ovary: Secondary | ICD-10-CM | POA: Diagnosis not present

## 2013-08-13 NOTE — Patient Instructions (Addendum)
-  Increase fluid intake  -Sparkling water  -Continue eating regularly -Incorporate protein with every meal and snack  -Fill up on non-starchy vegetables  -Get back into exercise routine  -Swimming  -Have a treat occasionally

## 2013-08-13 NOTE — Progress Notes (Signed)
  Medical Nutrition Therapy:  Appt start time: 0800 end time:  0900.   Assessment:  Primary concerns today: Catalaya is here today with concerns regarding recent weight gain. Finished chemo for ovarian cancer last fall. Currently "No Evidence of Disease." Gained 30 pounds during chemo course and currently in surgical menopause. She reports having trouble losing belly fat. Never had a weight problem previously. She used concepts from [employer], My Fitness Pal, and measuring food for weight maintenance in the past. Her goal weight is 150 pounds. She reports that she was 160-165 lbs before chemo and has lost a noticeable amount of muscle tone. 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 Sheva reports that she has had an aversion to drinking water since chemo. She does not sleep well at night due to muscle spasms.   Preferred Learning Style:  No preference indicated   Learning Readiness:  Ready   MEDICATIONS: see list   DIETARY INTAKE:  24-hr recall:  B ( AM): Premier protein shake  Snk ( AM):   L ( PM): Power bar or apples with peanut butter Snk ( PM):  D ( PM): "big meal" - chicken, rice, and salad Snk ( PM): something sweet - cookies or ice cream  Beverages: skim milk, coffee with powdered creamer, herbal tea, orange juice occasionally   Usual physical activity: Daily activities (naturally very active)  Estimated energy needs: 1600-1800 calories  Progress Towards Goal(s):  In progress.   Nutritional Diagnosis:  [place]-3.4 Unintentional weight gain As related to recent chemotherapy treatment and hysterectomy .  As evidenced by 30 pound weight gain per patient report.    Intervention:  Nutrition counseling provided.  Teaching Method Utilized:  Visual Auditory  Handouts given during visit include:  MyPlate  62U CHO + protein snacks  Barriers to learning/adherence to lifestyle change: none  Demonstrated degree of understanding via:   Teach Back   Monitoring/Evaluation:  Dietary intake, exercise, and body weight in 3 month(s).

## 2013-08-26 ENCOUNTER — Ambulatory Visit: Payer: BC Managed Care – PPO | Admitting: Gynecologic Oncology

## 2013-09-07 ENCOUNTER — Telehealth: Payer: Self-pay | Admitting: Gynecologic Oncology

## 2013-09-07 NOTE — Telephone Encounter (Signed)
GYNECOLOGIC ONCOLOGY OFFICE VISIT  CHIEF COMPLAINT: Ovarian cancer surveillance  ASSESSMENT/PLAN:  53 y.o.  with Stage IIB carcinosarcoma of the ovary No evidence of disease F/U in 3 months  Obesity: Activity is limited by Achilles tendon discomfort Will consider swimming to increase her activity  HISTORY OF PRESENT ILLNESS Morgan Olsen is a 53 y.o. woman who initially presented to the ER with pelvic pain.   A CT scan in the ED showed an 11cm complex left ovarian mass with mild mesenteric/omental stranding, small ascites, and peritoneal nodularity. She was referred to Dr. Gertie Fey, who performed a TVUS confirming an 11.5 cm complex left ovarian mass. No free fluid was seen and the uterus was otherwise normal. Her CA-125 was elevated at 1600.  Oncology Summary:  07/15/2012 Surgery TAH, BSO, ovarian cancer staging, appendectomy optimal debulking no gross residual disease  Intraoperative Findings: 10 cm Left ovary with cyst containing old blood and excrescences. The ovarian cyst was already ruptured upon entry to the abdomen. Adenocarcinoma of the ovary on frozen section: cannot rule out non-gyn primary. Left ovarian mass adherent to the uterine fundus and the sigmoid colon mesentery. Left ureter dilated to 1 cm prior to mass removal. Friable, dark soft tissue implants throughout the posterior cul-de-sac and adherent to the appendix. Bilateral pelvic adenopathy, largest 1 cm. Normal-appearing right tube and ovary. No masses noted on the small or large bowel, the mesentery, the liver edge, or the diaphragm. At the end of the case there was no residual disease.  Pathology: Ovary, left, oophorectomy - Carcinosarcoma, homologous type, consisting of mixed endometrioid, clear cell, mucinous, and squamous cell carcinoma components and homologous sarcomatous component.   Received 4 cyles Taxol/Carboplatin and then presented with c/o being unable to grasp and hold objects. Chemotherapy was then changed to  docetaxol/Carbo.  Last cycle of taxane/platin administered 11/2012  CT 11/2012 IMPRESSION: --New round hypodense lesions along the right pelvic sidewall and adjacent to the left proximal common iliac vessels measuring fluid density are likely lymphoceles, seromas or hematomas.  --Indeterminate subcentimeter hyperattenuating lesion in segment V of the liver, too small to characterize, unchanged. -- Focal narrowing of the gallbladder body with focal thickening along the gallbladder fundus, similar to prior. Question adenomyomatosis, however a gallbladder mass cannot be excluded. Consider gallbladder ultrasound as clinically indicated.  PET 12/2012 GU Tract: Sequela of prior TAH/BSO with regional lymphadenectomy is again noted. Cystic lesions are again noted adjacent to the left common iliac artery (series 4, image 208), and the right pelvic side wall (series 4, image 235), which are stable to slightly decreased in size, and do not demonstrate abnormal FDG uptake. No new soft tissue lesions or abnormal FDG uptake is noted in the resection bed or associated with the residual vaginal fornices. Mild FDG uptake is associated with the inferior aspect of the midline surgical incision, with stable soft tissue stranding, likely representing scar tissue. Adenopathy: None  MUSCULOSKELETAL: Sclerotic lesions are again noted in the L4 vertebral body, right iliac bone, and right sacrum, which are unchanged and demonstrate no elevated FDG uptake, consistent with benign bone islands. No suspicious metabolically active osseous lesions are identified.  No foci of abnormal FDG uptake are noted involving the external soft tissues.  IMPRESSION: -No metabolically active disease. -Mild decrease in size of previously noted cystic lesions in the right pelvic sidewall and adjacent to the left common iliac lymphadenectomy site, consistent with seroma or lymphocele. -Gallbladder stones/sludge, without evidence of cholecystitis  or metabolically active gallbladder mass.  Repeat PET  05/2013 No evidence of recurrent disease.  Doing well.  Reports weight gain.   Past Medical History: . Hypertension  . Endometriosis  Identified at the time of cesarean section  . Asthma  . Melanoma in situ s/p excision  . Anxiety  . Ovarian cancer 06/2012    Past Surgical History  . Wisdom tooth extraction 1980s  . Tonsillectomy and adenoidectomy 1980s  . Cesarean section 1998  . Endometrial ablation 2011 For menorrhagia  RESECTION(INITIAL) OVARIAN/TUBAL/PRIMA PERTONL MALIG W/BIL S&O, OMNTECT; W/TAH/LTD PARA-AORTIC LYMPHADENECT: APPENDECTOMY; . Abdominal hysterectomy 07/15/2012  .COLONOSCOPY, FLEXIBLE, PROXIMAL TO SPLENIC FLEXURE; DIAGNOSTIC, W/WO COLLECTION SPECIMEN BY BRUSH OR [place];  07/17/2012  . Melanoma insitu resection Left 2007 resected from underside of left forearm    Family History  . Colon cancer Other 33  . Uterine cancer Other 54  . Colon cancer Father 36 one polyp  . Cancer Mother Thyroid  . Asthma Mother  . Ovarian cancer Other 23  . Stroke Paternal Grandmother   REVIEW OF SYSTEMS Constitutional  Feels well, more energy, alopecia has resolved. Cardiovascular  No chest pain, shortness of breath, or edema  Pulmonary  No cough or wheeze.  Gastro Intestinal  No nausea, vomitting, or diarrhoea. No bright red blood per rectum, intermittent mild abdominal discomfort with bloating, no change in bowel movement, or constipation.  Genito Urinary  No frequency, urgency, dysuria, mild residual neuropathy Musculo Skeletal  No myalgia, arthralgia, joint swelling or pain  Neurologic  No weakness, numbness, reports change in gait because of achilles tendon issues, unable to exercise Psychology  No depression, anxiety, insomnia.    PHYSICAL EXAMINATION: BP 136/89  Pulse 72  Temp(Src) 98.5 F (36.9 C) (Oral)  Resp 18  Ht 5' 9.84" (1.774 m)  Wt 192 lb (87.091 kg)  BMI 27.67 kg/m2 Wt Readings from Last 3  Encounters:  08/13/13 192 lb 9.6 oz (87.363 kg)  06/10/13 192 lb (87.091 kg)  02/25/13 186 lb 12.8 oz (84.732 kg)    WD female in NAD HEENT: Normal ROM, Sclera non icteric CHEST:  CTA ABD:  Soft NT, Incision C/D/I no masses, no fluid wave, non tender PELVIC:  Nl EGBUS, no vaginal bleeding or discharge EXT: 1+ edema bilaterally BACK:  No CVAT LN:  No cervical supraclavicular or inguinal adenopathy PELVIC:  Nl EGBUS, atrophic vagina, no masses, no cul de sac nodularity Rectal:  Good tone no masses, no nodularity

## 2013-09-09 ENCOUNTER — Encounter: Payer: Self-pay | Admitting: Gynecologic Oncology

## 2013-09-09 ENCOUNTER — Ambulatory Visit: Payer: BC Managed Care – PPO | Attending: Gynecologic Oncology | Admitting: Gynecologic Oncology

## 2013-09-09 VITALS — BP 154/100 | HR 96 | Temp 98.8°F | Resp 18 | Ht 69.5 in | Wt 192.1 lb

## 2013-09-09 DIAGNOSIS — C439 Malignant melanoma of skin, unspecified: Secondary | ICD-10-CM | POA: Insufficient documentation

## 2013-09-09 DIAGNOSIS — F411 Generalized anxiety disorder: Secondary | ICD-10-CM | POA: Insufficient documentation

## 2013-09-09 DIAGNOSIS — Z8543 Personal history of malignant neoplasm of ovary: Secondary | ICD-10-CM

## 2013-09-09 DIAGNOSIS — Z90710 Acquired absence of both cervix and uterus: Secondary | ICD-10-CM | POA: Insufficient documentation

## 2013-09-09 DIAGNOSIS — J45909 Unspecified asthma, uncomplicated: Secondary | ICD-10-CM | POA: Insufficient documentation

## 2013-09-09 DIAGNOSIS — I10 Essential (primary) hypertension: Secondary | ICD-10-CM | POA: Insufficient documentation

## 2013-09-09 DIAGNOSIS — C569 Malignant neoplasm of unspecified ovary: Secondary | ICD-10-CM | POA: Insufficient documentation

## 2013-09-09 DIAGNOSIS — Z9221 Personal history of antineoplastic chemotherapy: Secondary | ICD-10-CM | POA: Insufficient documentation

## 2013-09-09 NOTE — Patient Instructions (Signed)
No evidence of disease Follow-up in 3 months  Enjoy summer!!    Thank you very much Ms. Alla German for allowing me to provide care for you today.  I appreciate your confidence in choosing our Gynecologic Oncology team.  If you have any questions about your visit today please call our office and we will get back to you as soon as possible.  Please consider using the website Medlineplus.gov as an Geneticist, molecular.   Francetta Found. Isabeau Mccalla MD., PhD Gynecologic Oncology

## 2013-09-09 NOTE — Progress Notes (Signed)
GYNECOLOGIC ONCOLOGY OFFICE VISIT  CHIEF COMPLAINT: Ovarian cancer surveillance  ASSESSMENT/PLAN:  53 y.o.  with Stage IIB carcinosarcoma of the ovary No evidence of disease F/U in 3 months Flush port this week  HISTORY OF PRESENT ILLNESS Morgan Olsen is a 53 y.o. woman who initially presented to the ER with pelvic pain.   A CT scan in the ED showed an 11cm complex left ovarian mass with mild mesenteric/omental stranding, small ascites, and peritoneal nodularity. She was referred to Dr. Gertie Fey, who performed a TVUS confirming an 11.5 cm complex left ovarian mass. No free fluid was seen and the uterus was otherwise normal. Her CA-125 was elevated at 1600.  Oncology Summary:  07/15/2012 Surgery TAH, BSO, ovarian cancer staging, appendectomy optimal debulking no gross residual disease  Intraoperative Findings: 10 cm Left ovary with cyst containing old blood and excrescences. The ovarian cyst was already ruptured upon entry to the abdomen. Adenocarcinoma of the ovary on frozen section: cannot rule out non-gyn primary. Left ovarian mass adherent to the uterine fundus and the sigmoid colon mesentery. Left ureter dilated to 1 cm prior to mass removal. Friable, dark soft tissue implants throughout the posterior cul-de-sac and adherent to the appendix. Bilateral pelvic adenopathy, largest 1 cm. Normal-appearing right tube and ovary. No masses noted on the small or large bowel, the mesentery, the liver edge, or the diaphragm. At the end of the case there was no residual disease.  Pathology: Ovary, left, oophorectomy - Carcinosarcoma, homologous type, consisting of mixed endometrioid, clear cell, mucinous, and squamous cell carcinoma components and homologous sarcomatous component.   Received 4 cyles Taxol/Carboplatin and then presented with c/o being unable to grasp and hold objects. Chemotherapy was then changed to docetaxol/Carbo.  Last cycle of taxane/platin administered 11/2012  CT  11/2012 IMPRESSION: --New round hypodense lesions along the right pelvic sidewall and adjacent to the left proximal common iliac vessels measuring fluid density are likely lymphoceles, seromas or hematomas.  --Indeterminate subcentimeter hyperattenuating lesion in segment V of the liver, too small to characterize, unchanged. -- Focal narrowing of the gallbladder body with focal thickening along the gallbladder fundus, similar to prior. Question adenomyomatosis, however a gallbladder mass cannot be excluded. Consider gallbladder ultrasound as clinically indicated.  PET 12/2012 GU Tract: Sequela of prior TAH/BSO with regional lymphadenectomy is again noted. Cystic lesions are again noted adjacent to the left common iliac artery (series 4, image 208), and the right pelvic side wall (series 4, image 235), which are stable to slightly decreased in size, and do not demonstrate abnormal FDG uptake. No new soft tissue lesions or abnormal FDG uptake is noted in the resection bed or associated with the residual vaginal fornices. Mild FDG uptake is associated with the inferior aspect of the midline surgical incision, with stable soft tissue stranding, likely representing scar tissue. Adenopathy: None  MUSCULOSKELETAL: Sclerotic lesions are again noted in the L4 vertebral body, right iliac bone, and right sacrum, which are unchanged and demonstrate no elevated FDG uptake, consistent with benign bone islands. No suspicious metabolically active osseous lesions are identified.  No foci of abnormal FDG uptake are noted involving the external soft tissues.  IMPRESSION: -No metabolically active disease. -Mild decrease in size of previously noted cystic lesions in the right pelvic sidewall and adjacent to the left common iliac lymphadenectomy site, consistent with seroma or lymphocele. -Gallbladder stones/sludge, without evidence of cholecystitis or metabolically active gallbladder mass.  Repeat PET 05/2013 No  evidence of recurrent disease.  Doing well.  Reports weight  gain.   Past Medical History: . Hypertension  . Endometriosis  Identified at the time of cesarean section  . Asthma  . Melanoma in situ s/p excision  . Anxiety  . Ovarian cancer 06/2012    Past Surgical History  . Wisdom tooth extraction 1980s  . Tonsillectomy and adenoidectomy 1980s  . Cesarean section 1998  . Endometrial ablation 2011 For menorrhagia  RESECTION(INITIAL) OVARIAN/TUBAL/PRIMA PERTONL MALIG W/BIL S&O, OMNTECT; W/TAH/LTD PARA-AORTIC LYMPHADENECT: APPENDECTOMY; . Abdominal hysterectomy 07/15/2012  .COLONOSCOPY, FLEXIBLE, PROXIMAL TO SPLENIC FLEXURE; DIAGNOSTIC, W/WO COLLECTION SPECIMEN BY BRUSH OR [place];  07/17/2012  . Melanoma insitu resection Left 2007 resected from underside of left forearm    Family History  . Colon cancer Other 70  . Uterine cancer Other 54  . Colon cancer Father 61 one polyp  . Cancer Mother Thyroid  . Asthma Mother  . Ovarian cancer Other 69  . 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  REVIEW OF SYSTEMS Constitutional  Feels well, more energy,  Cardiovascular  No chest pain, shortness of breath, or edema  Pulmonary  No cough or wheeze.  Gastro Intestinal  No nausea, vomitting, or diarrhoea. No bright red blood per rectum, intermittent mild abdominal discomfort with bloating unchanged for several months, no change in bowel movement, or constipation.  Genito Urinary  No frequency, urgency, dysuria, mild residual neuropathy Musculo Skeletal  No myalgia, arthralgia, joint swelling or pain  Neurologic  No weakness, numbness,  Psychology  No depression, anxiety, insomnia.    PHYSICAL EXAMINATION: BP 154/100  Pulse 96  Temp(Src) 98.8 F (37.1 C) (Oral)  Resp 18  Ht 5' 9.5" (1.765 m)  Wt 192 lb 1.6 oz (87.136 kg)  BMI 27.97 kg/m2 Wt Readings from Last 3 Encounters:  09/09/13 192 lb 1.6 oz (87.136 kg)   08/13/13 192 lb 9.6 oz (87.363 kg)  06/10/13 192 lb (87.091 kg)    WD female in NAD looks fantastic HEENT: Normal ROM, Sclera non icteric CHEST:  CTA CARDIAC: RRR ABD:  Soft NT, Incision C/D/I no masses, no fluid wave, non tender BACK:  No CVAT LN:  No cervical supraclavicular or inguinal adenopathy PELVIC:  Nl EGBUS, no vaginal bleeding or discharge, atrophic vagina no masses, no cul de sac nodularity Rectal:  Good tone no masses, no nodularity EXT: 1+ edema bilaterally

## 2013-09-20 ENCOUNTER — Ambulatory Visit (HOSPITAL_BASED_OUTPATIENT_CLINIC_OR_DEPARTMENT_OTHER): Payer: BC Managed Care – PPO

## 2013-09-20 VITALS — BP 145/99 | HR 85 | Temp 98.5°F

## 2013-09-20 DIAGNOSIS — Z95828 Presence of other vascular implants and grafts: Secondary | ICD-10-CM

## 2013-09-20 DIAGNOSIS — C569 Malignant neoplasm of unspecified ovary: Secondary | ICD-10-CM

## 2013-09-20 DIAGNOSIS — Z452 Encounter for adjustment and management of vascular access device: Secondary | ICD-10-CM

## 2013-09-20 MED ORDER — HEPARIN SOD (PORK) LOCK FLUSH 100 UNIT/ML IV SOLN
500.0000 [IU] | Freq: Once | INTRAVENOUS | Status: AC
  Administered 2013-09-20: 500 [IU] via INTRAVENOUS
  Filled 2013-09-20: qty 5

## 2013-09-20 MED ORDER — SODIUM CHLORIDE 0.9 % IJ SOLN
10.0000 mL | INTRAMUSCULAR | Status: DC | PRN
Start: 2013-09-20 — End: 2013-09-20
  Administered 2013-09-20: 10 mL via INTRAVENOUS
  Filled 2013-09-20: qty 10

## 2013-09-20 NOTE — Patient Instructions (Signed)

## 2013-11-15 ENCOUNTER — Ambulatory Visit: Payer: BC Managed Care – PPO | Admitting: Dietician

## 2013-11-29 ENCOUNTER — Ambulatory Visit: Payer: BC Managed Care – PPO | Attending: Gynecologic Oncology | Admitting: Gynecologic Oncology

## 2013-11-29 ENCOUNTER — Encounter: Payer: Self-pay | Admitting: Gynecologic Oncology

## 2013-11-29 ENCOUNTER — Ambulatory Visit (HOSPITAL_BASED_OUTPATIENT_CLINIC_OR_DEPARTMENT_OTHER): Payer: BC Managed Care – PPO

## 2013-11-29 VITALS — BP 129/86 | HR 75 | Temp 98.2°F | Resp 20 | Ht 69.5 in | Wt 198.6 lb

## 2013-11-29 DIAGNOSIS — Z800 Family history of malignant neoplasm of digestive organs: Secondary | ICD-10-CM | POA: Insufficient documentation

## 2013-11-29 DIAGNOSIS — Z9079 Acquired absence of other genital organ(s): Secondary | ICD-10-CM | POA: Insufficient documentation

## 2013-11-29 DIAGNOSIS — C569 Malignant neoplasm of unspecified ovary: Secondary | ICD-10-CM

## 2013-11-29 DIAGNOSIS — Z08 Encounter for follow-up examination after completed treatment for malignant neoplasm: Secondary | ICD-10-CM | POA: Insufficient documentation

## 2013-11-29 DIAGNOSIS — Z85820 Personal history of malignant melanoma of skin: Secondary | ICD-10-CM | POA: Diagnosis not present

## 2013-11-29 DIAGNOSIS — I10 Essential (primary) hypertension: Secondary | ICD-10-CM | POA: Insufficient documentation

## 2013-11-29 DIAGNOSIS — R1032 Left lower quadrant pain: Secondary | ICD-10-CM

## 2013-11-29 DIAGNOSIS — Z808 Family history of malignant neoplasm of other organs or systems: Secondary | ICD-10-CM | POA: Diagnosis not present

## 2013-11-29 DIAGNOSIS — C562 Malignant neoplasm of left ovary: Secondary | ICD-10-CM

## 2013-11-29 DIAGNOSIS — Z9221 Personal history of antineoplastic chemotherapy: Secondary | ICD-10-CM | POA: Insufficient documentation

## 2013-11-29 DIAGNOSIS — Z90710 Acquired absence of both cervix and uterus: Secondary | ICD-10-CM | POA: Diagnosis not present

## 2013-11-29 DIAGNOSIS — Z95828 Presence of other vascular implants and grafts: Secondary | ICD-10-CM

## 2013-11-29 DIAGNOSIS — Z8049 Family history of malignant neoplasm of other genital organs: Secondary | ICD-10-CM | POA: Diagnosis not present

## 2013-11-29 DIAGNOSIS — Z8041 Family history of malignant neoplasm of ovary: Secondary | ICD-10-CM | POA: Insufficient documentation

## 2013-11-29 DIAGNOSIS — Z8543 Personal history of malignant neoplasm of ovary: Secondary | ICD-10-CM | POA: Insufficient documentation

## 2013-11-29 MED ORDER — SODIUM CHLORIDE 0.9 % IJ SOLN
10.0000 mL | INTRAMUSCULAR | Status: DC | PRN
  Administered 2013-11-29: 10 mL via INTRAVENOUS
  Filled 2013-11-29: qty 10

## 2013-11-29 MED ORDER — HEPARIN SOD (PORK) LOCK FLUSH 100 UNIT/ML IV SOLN
500.0000 [IU] | Freq: Once | INTRAVENOUS | Status: AC
  Administered 2013-11-29: 500 [IU] via INTRAVENOUS
  Filled 2013-11-29: qty 5

## 2013-11-29 NOTE — Progress Notes (Signed)
GYNECOLOGIC ONCOLOGY OFFICE VISIT  CHIEF COMPLAINT: Ovarian cancer surveillance  ASSESSMENT/PLAN:  53 y.o.  with Stage IIB carcinosarcoma of the ovary No evidence of disease since completion of chemotherapy 11/2012 F/U in 3 months Flush port  Abdominal pain CT abdomen and pelvis.  HISTORY OF PRESENT ILLNESS Morgan Olsen is a 53 y.o. woman who initially presented to the ER with pelvic pain.   A CT scan in the ED showed an 11cm complex left ovarian mass with mild mesenteric/omental stranding, small ascites, and peritoneal nodularity. She was referred to Dr. Gertie Fey, who performed a TVUS confirming an 11.5 cm complex left ovarian mass. No free fluid was seen and the uterus was otherwise normal. Her CA-125 was elevated at 1600.  Oncology Summary:  07/15/2012 Surgery TAH, BSO, ovarian cancer staging, appendectomy optimal debulking no gross residual disease  Intraoperative Findings: 10 cm Left ovary with cyst containing old blood and excrescences. The ovarian cyst was already ruptured upon entry to the abdomen. Adenocarcinoma of the ovary on frozen section: cannot rule out non-gyn primary. Left ovarian mass adherent to the uterine fundus and the sigmoid colon mesentery. Left ureter dilated to 1 cm prior to mass removal. Friable, dark soft tissue implants throughout the posterior cul-de-sac and adherent to the appendix. Bilateral pelvic adenopathy, largest 1 cm. Normal-appearing right tube and ovary. No masses noted on the small or large bowel, the mesentery, the liver edge, or the diaphragm. At the end of the case there was no residual disease.  Pathology: Ovary, left, oophorectomy - Carcinosarcoma, homologous type, consisting of mixed endometrioid, clear cell, mucinous, and squamous cell carcinoma components and homologous sarcomatous component.   Received 4 cyles Taxol/Carboplatin and then presented with c/o being unable to grasp and hold objects. Chemotherapy was then changed to docetaxol/Carbo.   Last cycle of taxane/platin administered 11/2012  Reports one month of new intermittent LLQ pain without radiation. Denies dietary changes.  Is exercising more.   Past Medical History: . Hypertension  . Endometriosis  Identified at the time of cesarean section  . Asthma  . Melanoma in situ s/p excision  . Anxiety  . Ovarian cancer 06/2012    Past Surgical History  . Wisdom tooth extraction 1980s  . Tonsillectomy and adenoidectomy 1980s  . Cesarean section 1998  . Endometrial ablation 2011 For menorrhagia  RESECTION(INITIAL) OVARIAN/TUBAL/PRIMA PERTONL MALIG W/BIL S&O, OMNTECT; W/TAH/LTD PARA-AORTIC LYMPHADENECT: APPENDECTOMY; . Abdominal hysterectomy 07/15/2012  .COLONOSCOPY, FLEXIBLE, PROXIMAL TO SPLENIC FLEXURE; DIAGNOSTIC, W/WO COLLECTION SPECIMEN BY BRUSH OR [place];  07/17/2012  . Melanoma insitu resection Left 2007 resected from underside of left forearm    Family History  . Colon cancer Other 87  . Uterine cancer Other 54  . Colon cancer Father 60 one polyp  . Cancer Mother Thyroid  . Asthma Mother  . Ovarian cancer Other 98  . Stroke Paternal Grandmother   Social History Daughter will attend [employer] college on a swim scholarship. Next year.  [name] requests to remain alive to pay the large bills.  REVIEW OF SYSTEMS Constitutional  Feels well, more energy,  Cardiovascular  No chest pain, shortness of breath, or edema  Pulmonary  No cough or wheeze.  Gastro Intestinal  No nausea, vomitting, or diarrhoea. No bright red blood per rectum, intermittent LLQ  abdominal discomfort  New over the past month.  Occurs several times per day Denies radiation. No change in bowel movement, or constipation.  Genito Urinary  No frequency, urgency, dysuria, mild residual neuropathy Musculo Skeletal  No myalgia, arthralgia, joint swelling or pain  Neurologic  No weakness, numbness,  Psychology  No depression, anxiety, insomnia.    PHYSICAL EXAMINATION: BP 129/86  Pulse 75   Temp(Src) 98.2 F (36.8 C) (Oral)  Resp 20  Ht 5' 9.5" (1.765 m)  Wt 198 lb 9.6 oz (90.084 kg)  BMI 28.92 kg/m2 Wt Readings from Last 3 Encounters:  11/29/13 198 lb 9.6 oz (90.084 kg)  09/09/13 192 lb 1.6 oz (87.136 kg)  08/13/13 192 lb 9.6 oz (87.363 kg)    WD female in NAD looks happy HEENT: Normal ROM, Sclera non icteric CHEST:  CTA CARDIAC: RRR ABD:  Soft NT, Incision C/D/I no masses, no fluid wave, non tender BACK:  No CVAT LN:  No cervical supraclavicular or inguinal adenopathy PELVIC:  Nl EGBUS, no vaginal bleeding or discharge, atrophic vagina no masses, no cul de sac nodularity Rectal:  Good tone no masses, no nodularity EXT: 1+ edema bilaterally PSYCH:  Appropriate mood affect judgement and reasoning

## 2013-11-29 NOTE — Patient Instructions (Signed)

## 2013-11-29 NOTE — Patient Instructions (Addendum)
HAPPY 1 year ANNIVERSARY  We will contact you with the results of your CT scan. Follow-up in 3 months    Thank you very much Ms. Alla German for allowing me to provide care for you today.  I appreciate your confidence in choosing our Gynecologic Oncology team.  If you have any questions about your visit today please call our office and we will get back to you as soon as possible.  Please consider using the website Medlineplus.gov as an Geneticist, molecular.   Francetta Found. Thailyn Khalid MD., PhD Gynecologic Oncology

## 2013-12-03 ENCOUNTER — Other Ambulatory Visit: Payer: Self-pay

## 2013-12-07 ENCOUNTER — Encounter (HOSPITAL_COMMUNITY): Payer: Self-pay

## 2013-12-07 ENCOUNTER — Ambulatory Visit (HOSPITAL_COMMUNITY)
Admission: RE | Admit: 2013-12-07 | Discharge: 2013-12-07 | Disposition: A | Discharge status: Home / Self Care | Payer: BC Managed Care – PPO | Source: Ambulatory Visit | Attending: Gynecologic Oncology | Admitting: Gynecologic Oncology

## 2013-12-07 DIAGNOSIS — Z90710 Acquired absence of both cervix and uterus: Secondary | ICD-10-CM | POA: Diagnosis not present

## 2013-12-07 DIAGNOSIS — R1909 Other intra-abdominal and pelvic swelling, mass and lump: Secondary | ICD-10-CM | POA: Diagnosis not present

## 2013-12-07 DIAGNOSIS — M899 Disorder of bone, unspecified: Secondary | ICD-10-CM | POA: Diagnosis not present

## 2013-12-07 DIAGNOSIS — C569 Malignant neoplasm of unspecified ovary: Secondary | ICD-10-CM | POA: Insufficient documentation

## 2013-12-07 DIAGNOSIS — Z9221 Personal history of antineoplastic chemotherapy: Secondary | ICD-10-CM | POA: Insufficient documentation

## 2013-12-07 MED ORDER — IOHEXOL 300 MG/ML  SOLN
100.0000 mL | Freq: Once | INTRAMUSCULAR | Status: AC | PRN
  Administered 2013-12-07: 100 mL via INTRAVENOUS

## 2014-03-31 ENCOUNTER — Ambulatory Visit: Payer: BLUE CROSS/BLUE SHIELD | Attending: Gynecologic Oncology | Admitting: Gynecologic Oncology

## 2014-03-31 ENCOUNTER — Ambulatory Visit: Payer: Self-pay

## 2014-03-31 VITALS — BP 118/85 | HR 63 | Temp 98.1°F | Resp 19 | Ht 69.5 in | Wt 195.4 lb

## 2014-03-31 DIAGNOSIS — C569 Malignant neoplasm of unspecified ovary: Secondary | ICD-10-CM | POA: Diagnosis present

## 2014-03-31 DIAGNOSIS — Z1231 Encounter for screening mammogram for malignant neoplasm of breast: Secondary | ICD-10-CM

## 2014-03-31 DIAGNOSIS — Z95828 Presence of other vascular implants and grafts: Secondary | ICD-10-CM

## 2014-03-31 DIAGNOSIS — Z8543 Personal history of malignant neoplasm of ovary: Secondary | ICD-10-CM

## 2014-03-31 MED ORDER — HEPARIN SOD (PORK) LOCK FLUSH 100 UNIT/ML IV SOLN
500.0000 [IU] | Freq: Once | INTRAVENOUS | Status: AC
  Administered 2014-03-31: 500 [IU] via INTRAVENOUS
  Filled 2014-03-31: qty 5

## 2014-03-31 MED ORDER — SODIUM CHLORIDE 0.9 % IJ SOLN
10.0000 mL | INTRAMUSCULAR | Status: DC | PRN
  Administered 2014-03-31: 10 mL via INTRAVENOUS
  Filled 2014-03-31: qty 10

## 2014-03-31 NOTE — Patient Instructions (Signed)
We will arrange a mammogram for you.  Plan to follow up with Dr. Gaetano Net in three months and Dr. Skeet Latch in six months.  Please call closer to the date to schedule your appointment.  Please call for any questions or concerns.  Thank you for coming to see [place] today.  We appreciate your confidence in choosing [facility] for your medical care.  If you have any questions about your visit today, please call our office and we will get back to you as soon as possible.  Dr. Janie Morning and Joylene John, NP Gynecologic Oncology

## 2014-03-31 NOTE — Patient Instructions (Signed)

## 2014-03-31 NOTE — Progress Notes (Signed)
GYNECOLOGIC ONCOLOGY OFFICE VISIT  CHIEF COMPLAINT: Ovarian cancer surveillance  ASSESSMENT/PLAN:  54 y.o.  with Stage IIB carcinosarcoma of the ovary No evidence of disease since completion of chemotherapy 11/2012 F/U  With Dr. Gaetano Net in 3 months Follow-up with Gyn Oncology in 6 months Flush port Rec mammogram   HISTORY OF PRESENT ILLNESS Morgan Olsen is a 54 y.o. woman who initially presented to the ER with pelvic pain.   A CT scan in the ED showed an 11cm complex left ovarian mass with mild mesenteric/omental stranding, small ascites, and peritoneal nodularity. She was referred to Dr. Gertie Fey, who performed a TVUS confirming an 11.5 cm complex left ovarian mass. No free fluid was seen and the uterus was otherwise normal. Her CA-125 was elevated at 1600.  Oncology Summary:  07/15/2012 Surgery TAH, BSO, ovarian cancer staging, appendectomy optimal debulking no gross residual disease  Intraoperative Findings: 10 cm Left ovary with cyst containing old blood and excrescences. The ovarian cyst was already ruptured upon entry to the abdomen. Adenocarcinoma of the ovary on frozen section: cannot rule out non-gyn primary. Left ovarian mass adherent to the uterine fundus and the sigmoid colon mesentery. Left ureter dilated to 1 cm prior to mass removal. Friable, dark soft tissue implants throughout the posterior cul-de-sac and adherent to the appendix. Bilateral pelvic adenopathy, largest 1 cm. Normal-appearing right tube and ovary. No masses noted on the small or large bowel, the mesentery, the liver edge, or the diaphragm. At the end of the case there was no residual disease.  Pathology: Ovary, left, oophorectomy - Carcinosarcoma, homologous type, consisting of mixed endometrioid, clear cell, mucinous, and squamous cell carcinoma components and homologous sarcomatous component.   Received 4 cyles Taxol/Carboplatin and then presented with c/o being unable to grasp and hold objects. Chemotherapy was  then changed to docetaxol/Carbo.  Last cycle of taxane/platin administered 11/2012    Past Medical History: . Hypertension  . Endometriosis  Identified at the time of cesarean section  . Asthma  . Melanoma in situ s/p excision  . Anxiety  . Ovarian cancer 06/2012    Past Surgical History  . Wisdom tooth extraction 1980s  . Tonsillectomy and adenoidectomy 1980s  . Cesarean section 1998  . Endometrial ablation 2011 For menorrhagia  RESECTION(INITIAL) OVARIAN/TUBAL/PRIMA PERTONL MALIG W/BIL S&O, OMNTECT; W/TAH/LTD PARA-AORTIC LYMPHADENECT: APPENDECTOMY; . Abdominal hysterectomy 07/15/2012  .COLONOSCOPY, FLEXIBLE, PROXIMAL TO SPLENIC FLEXURE; DIAGNOSTIC, W/WO COLLECTION SPECIMEN BY BRUSH OR [place];  07/17/2012  . Melanoma insitu resection Left 2007 resected from underside of left forearm    Family History  . Colon cancer Other 25  . Uterine cancer Other 54  . Colon cancer Father 6 one polyp  . Cancer Mother Thyroid  . Asthma Mother  . Ovarian cancer Other 63  . Stroke Paternal Grandmother   Social History Daughter will attend [employer] college on a swim scholarship in the fall.  Morgan Olsen did not have a mammogram last year because she felt she had received enough radiation.  REVIEW OF SYSTEMS Constitutional  Feels well, more energy,  Cardiovascular  No chest pain, shortness of breath, or edema  Pulmonary  No cough or wheeze.  Gastro Intestinal  No nausea, vomitting, or diarrhoea. No bright red blood per rectum, No change in bowel movement, or constipation.  Genito Urinary  No frequency, urgency, dysuria,Musculo Skeletal  No myalgia, arthralgia, joint swelling or pain  Neurologic  No weakness, numbness,  Psychology  No depression, anxiety, insomnia.    PHYSICAL EXAMINATION: BP 118/85  mmHg  Pulse 63  Temp(Src) 98.1 F (36.7 C) (Oral)  Resp 19  Ht 5' 9.5" (1.765 m)  Wt 195 lb 6.4 oz (88.633 kg)  BMI 28.45 kg/m2 Wt Readings from Last 3 Encounters:  03/31/14 195 lb  6.4 oz (88.633 kg)  11/29/13 198 lb 9.6 oz (90.084 kg)  09/09/13 192 lb 1.6 oz (87.136 kg)    WD female in NAD looks happy HEENT: Normal ROM, Sclera non icteric CHEST:  CTA CARDIAC: RRR ABD:  Soft NT, Incision C/D/I no masses, no fluid wave, non tender BACK:  No CVAT LN:  No cervical supraclavicular or inguinal adenopathy PELVIC:  Nl EGBUS, no vaginal bleeding or discharge, atrophic vagina no masses, no cul de sac nodularity Rectal:  Good tone no masses, no nodularity EXT: 1+ edema bilaterally PSYCH:  Appropriate mood affect judgement and reasoning

## 2014-04-07 ENCOUNTER — Ambulatory Visit
Admission: RE | Admit: 2014-04-07 | Discharge: 2014-04-07 | Disposition: A | Discharge status: Home / Self Care | Payer: BLUE CROSS/BLUE SHIELD | Source: Ambulatory Visit | Attending: Gynecologic Oncology | Admitting: Gynecologic Oncology

## 2014-04-07 ENCOUNTER — Ambulatory Visit: Payer: Self-pay | Admitting: Gynecologic Oncology

## 2014-04-07 DIAGNOSIS — Z1231 Encounter for screening mammogram for malignant neoplasm of breast: Secondary | ICD-10-CM

## 2014-06-10 ENCOUNTER — Other Ambulatory Visit: Payer: Self-pay | Admitting: Obstetrics and Gynecology

## 2014-06-15 LAB — CYTOLOGY - PAP

## 2014-09-29 ENCOUNTER — Ambulatory Visit: Payer: BLUE CROSS/BLUE SHIELD | Admitting: Gynecologic Oncology

## 2014-10-20 ENCOUNTER — Encounter: Payer: Self-pay | Admitting: Gynecologic Oncology

## 2014-10-26 ENCOUNTER — Telehealth: Payer: Self-pay | Admitting: Gynecologic Oncology

## 2014-10-26 NOTE — Telephone Encounter (Signed)
GYNECOLOGIC ONCOLOGY OFFICE VISIT  CHIEF COMPLAINT: Ovarian cancer surveillance  ASSESSMENT/PLAN:  54 y.o.  with Stage IIB carcinosarcoma of the ovary No evidence of disease since completion of chemotherapy 11/2012 F/U  With Dr. Gaetano Net in 3 months Follow-up with Gyn Oncology in 6 months Flush port Rec mammogram   HISTORY OF PRESENT ILLNESS Morgan Olsen is a 54 y.o. woman who initially presented to the ER with pelvic pain.   A CT scan in the ED showed an 11cm complex left ovarian mass with mild mesenteric/omental stranding, small ascites, and peritoneal nodularity. She was referred to Dr. Gertie Fey, who performed a TVUS confirming an 11.5 cm complex left ovarian mass. No free fluid was seen and the uterus was otherwise normal. Her CA-125 was elevated at 1600.  Oncology Summary:  07/15/2012 Surgery TAH, BSO, ovarian cancer staging, appendectomy optimal debulking no gross residual disease  Intraoperative Findings: 10 cm Left ovary with cyst containing old blood and excrescences. The ovarian cyst was already ruptured upon entry to the abdomen. Adenocarcinoma of the ovary on frozen section: cannot rule out non-gyn primary. Left ovarian mass adherent to the uterine fundus and the sigmoid colon mesentery. Left ureter dilated to 1 cm prior to mass removal. Friable, dark soft tissue implants throughout the posterior cul-de-sac and adherent to the appendix. Bilateral pelvic adenopathy, largest 1 cm. Normal-appearing right tube and ovary. No masses noted on the small or large bowel, the mesentery, the liver edge, or the diaphragm. At the end of the case there was no residual disease.  Pathology: Ovary, left, oophorectomy - Carcinosarcoma, homologous type, consisting of mixed endometrioid, clear cell, mucinous, and squamous cell carcinoma components and homologous sarcomatous component.   Received 4 cyles Taxol/Carboplatin and then presented with c/o being unable to grasp and hold objects. Chemotherapy was  then changed to docetaxol/Carbo.  Last cycle of taxane/platin administered 11/2012    Past Medical History: . Hypertension  . Endometriosis  Identified at the time of cesarean section  . Asthma  . Melanoma in situ s/p excision  . Anxiety  . Ovarian cancer 06/2012   Past Surgical History  Procedure Laterality Date  . Cesarean section    . Tonsillectomy    . Ablation    . Abdominal hysterectomy  07/15/2012    @ [facility] with oophorectomy  . Appendectomy     . Endometrial ablation 2011 For menorrhagia  RESECTION(INITIAL) OVARIAN/TUBAL/PRIMA PERTONL MALIG W/BIL S&O, OMNTECT; W/TAH/LTD PARA-AORTIC LYMPHADENECT:   .COLONOSCOPY, FLEXIBLE, PROXIMAL TO SPLENIC FLEXURE; DIAGNOSTIC, W/WO COLLECTION SPECIMEN BY BRUSH OR [place];  07/17/2012  . Melanoma insitu resection Left 2007 resected from underside of left forearm    Family History  . Colon cancer Other 95  . Uterine cancer Other 54  . Colon cancer Father 67 one polyp  . Cancer Mother Thyroid  . Asthma Mother  . Ovarian cancer Other 35  . Stroke Paternal Grandmother   Social History Daughter will attend [employer] college on a swim scholarship in the fall.  Morgan Olsen did not have a mammogram last year because she felt she had received enough radiation.  REVIEW OF SYSTEMS Constitutional  Feels well, more energy,  Cardiovascular  No chest pain, shortness of breath, or edema  Pulmonary  No cough or wheeze.  Gastro Intestinal  No nausea, vomitting, or diarrhoea. No bright red blood per rectum, No change in bowel movement, or constipation.  Genito Urinary  No frequency, urgency, dysuria,Musculo Skeletal  No myalgia, arthralgia, joint swelling or pain  Neurologic  No weakness, numbness,  Psychology  No depression, anxiety, insomnia.    PHYSICAL EXAMINATION: BP 118/85 mmHg  Pulse 63  Temp(Src) 98.1 F (36.7 C) (Oral)  Resp 19  Ht 5' 9.5" (1.765 m)  Wt 195 lb 6.4 oz (88.633 kg)  BMI 28.45 kg/m2 Wt Readings from Last 3 Encounters:   03/31/14 195 lb 6.4 oz (88.633 kg)  11/29/13 198 lb 9.6 oz (90.084 kg)  09/09/13 192 lb 1.6 oz (87.136 kg)    WD female in NAD looks happy HEENT: Normal ROM, Sclera non icteric CHEST:  CTA CARDIAC: RRR ABD:  Soft NT, Incision C/D/I no masses, no fluid wave, non tender BACK:  No CVAT LN:  No cervical supraclavicular or inguinal adenopathy PELVIC:  Nl EGBUS, no vaginal bleeding or discharge, atrophic vagina no masses, no cul de sac nodularity Rectal:  Good tone no masses, no nodularity EXT: 1+ edema bilaterally PSYCH:  Appropriate mood affect judgement and reasoning

## 2014-10-27 ENCOUNTER — Ambulatory Visit (HOSPITAL_BASED_OUTPATIENT_CLINIC_OR_DEPARTMENT_OTHER): Payer: BLUE CROSS/BLUE SHIELD

## 2014-10-27 ENCOUNTER — Ambulatory Visit: Payer: BLUE CROSS/BLUE SHIELD | Attending: Gynecologic Oncology | Admitting: Gynecologic Oncology

## 2014-10-27 ENCOUNTER — Encounter: Payer: Self-pay | Admitting: Gynecologic Oncology

## 2014-10-27 VITALS — BP 140/92 | HR 72 | Temp 98.2°F | Resp 18

## 2014-10-27 DIAGNOSIS — C569 Malignant neoplasm of unspecified ovary: Secondary | ICD-10-CM | POA: Diagnosis not present

## 2014-10-27 DIAGNOSIS — E669 Obesity, unspecified: Secondary | ICD-10-CM | POA: Insufficient documentation

## 2014-10-27 DIAGNOSIS — Z95828 Presence of other vascular implants and grafts: Secondary | ICD-10-CM

## 2014-10-27 DIAGNOSIS — C562 Malignant neoplasm of left ovary: Secondary | ICD-10-CM

## 2014-10-27 MED ORDER — HEPARIN SOD (PORK) LOCK FLUSH 100 UNIT/ML IV SOLN
500.0000 [IU] | Freq: Once | INTRAVENOUS | Status: AC
  Administered 2014-10-27: 500 [IU] via INTRAVENOUS
  Filled 2014-10-27: qty 5

## 2014-10-27 MED ORDER — SODIUM CHLORIDE 0.9 % IJ SOLN
10.0000 mL | INTRAMUSCULAR | Status: DC | PRN
Start: 2014-10-27 — End: 2014-10-27
  Administered 2014-10-27: 10 mL via INTRAVENOUS
  Filled 2014-10-27: qty 10

## 2014-10-27 NOTE — Progress Notes (Signed)
GYNECOLOGIC ONCOLOGY OFFICE VISIT  CHIEF COMPLAINT: Ovarian carcinsarcoma  surveillance  ASSESSMENT/PLAN:  54 y.o.  with Stage IIB carcinosarcoma of the ovary No evidence of disease since completion of chemotherapy 11/2012 F/U  With Dr. Gaetano Net in 07/2015 Follow-up with Gyn Oncology in 3 months Remove port   HISTORY OF PRESENT ILLNESS Morgan Olsen is a 54 y.o. woman who initially presented to the ER with pelvic pain.   A CT scan in the ED showed an 11cm complex left ovarian mass with mild mesenteric/omental stranding, small ascites, and peritoneal nodularity. She was referred to Dr. Gertie Fey, who performed a TVUS confirming an 11.5 cm complex left ovarian mass. No free fluid was seen and the uterus was otherwise normal. Her CA-125 was elevated at 1600.  Oncology Summary:  07/15/2012 Surgery TAH, BSO, ovarian cancer staging, appendectomy optimal debulking no gross residual disease  Intraoperative Findings: 10 cm Left ovary with cyst containing old blood and excrescences. The ovarian cyst was already ruptured upon entry to the abdomen. Adenocarcinoma of the ovary on frozen section: cannot rule out non-gyn primary. Left ovarian mass adherent to the uterine fundus and the sigmoid colon mesentery. Left ureter dilated to 1 cm prior to mass removal. Friable, dark soft tissue implants throughout the posterior cul-de-sac and adherent to the appendix. Bilateral pelvic adenopathy, largest 1 cm. Normal-appearing right tube and ovary. No masses noted on the small or large bowel, the mesentery, the liver edge, or the diaphragm. At the end of the case there was no residual disease.  Pathology: Ovary, left, oophorectomy - Carcinosarcoma, homologous type, consisting of mixed endometrioid, clear cell, mucinous, and squamous cell carcinoma components and homologous sarcomatous component.   Received 4 cyles Taxol/Carboplatin and then presented with c/o being unable to grasp and hold objects. Chemotherapy was then  changed to docetaxol/Carbo.  Last cycle of taxane/platin administered 11/2012  Lab Results  Component Value Date   CA125 15.6 09/10/2012  OSH CA125 06/13/2014 11   Past Medical History: . Hypertension  . Endometriosis  Identified at the time of cesarean section  . Asthma  . Melanoma in situ s/p excision  . Anxiety  . Ovarian cancer 06/2012   Past Surgical History  Procedure Laterality Date  . Cesarean section    . Tonsillectomy    . Ablation    . Abdominal hysterectomy  07/15/2012    @ [facility] with oophorectomy  . Appendectomy     . Endometrial ablation 2011 For menorrhagia  RESECTION(INITIAL) OVARIAN/TUBAL/PRIMA PERTONL MALIG W/BIL S&O, OMNTECT; W/TAH/LTD PARA-AORTIC LYMPHADENECT:   .COLONOSCOPY, FLEXIBLE, PROXIMAL TO SPLENIC FLEXURE; DIAGNOSTIC, W/WO COLLECTION SPECIMEN BY BRUSH OR [place];  07/17/2012  . Melanoma insitu resection Left 2007 resected from underside of left forearm    Family History  . Colon cancer Other 67  . Uterine cancer Other 54  . Colon cancer Father 73 one polyp  . Cancer Mother Thyroid  . Asthma Mother  . Ovarian cancer Other 79  . Stroke Paternal Grandmother   Social History Daughter moved into  [employer] college on a swim scholarship in the fall.  Is exercising  REVIEW OF SYSTEMS Constitutional  Feels well, more energy, feels like her body is her own Cardiovascular  No chest pain, shortness of breath, or edema  Pulmonary  No cough or wheeze.  Gastro Intestinal  No nausea, vomitting, or diarrhoea. No bright red blood per rectum, No change in bowel movement, or constipation. Abdominal fat weight gain Genito Urinary  No frequency, urgency, dysuria, Musculo Skeletal  No  myalgia, arthralgia, joint swelling or pain  Neurologic  No weakness, numbness,  Psychology  No depression, anxiety, insomnia.    PHYSICAL EXAMINATION: There were no vitals taken for this visit. Wt Readings from Last 3 Encounters:  03/31/14 195 lb 6.4 oz (88.633 kg)  11/29/13  198 lb 9.6 oz (90.084 kg)  09/09/13 192 lb 1.6 oz (87.136 kg)    WD female in NAD looks happy HEENT: Normal ROM, Sclera non icteric CHEST:  CTA CARDIAC: RRR ABD:  Soft NT, Incision C/D/I no masses, no fluid wave, non tender BACK:  No CVAT LN:  No cervical supraclavicular or inguinal adenopathy PELVIC:  Nl EGBUS, no vaginal bleeding or discharge, atrophic vagina no masses, no cul de sac nodularity Rectal:  Good tone no masses, no nodularity EXT: 1+ edema bilaterally PSYCH:  Appropriate mood affect judgement and reasoning

## 2014-10-27 NOTE — Patient Instructions (Signed)

## 2014-10-27 NOTE — Patient Instructions (Signed)
Plan to follow up in three months or sooner if needed.  We will arrange for you to have your port removed.

## 2014-10-31 ENCOUNTER — Telehealth: Payer: Self-pay | Admitting: *Deleted

## 2014-10-31 NOTE — Telephone Encounter (Signed)
Spoke with Morgan Olsen in IR, who notified pt of PAC appt 9/20 arrival at 730, appt at 930. Pt verbalized and confirmed.

## 2014-11-07 ENCOUNTER — Other Ambulatory Visit: Payer: Self-pay | Admitting: Radiology

## 2014-11-08 ENCOUNTER — Ambulatory Visit (HOSPITAL_COMMUNITY)
Admission: RE | Admit: 2014-11-08 | Discharge: 2014-11-08 | Disposition: A | Discharge status: Home / Self Care | Payer: BLUE CROSS/BLUE SHIELD | Source: Ambulatory Visit | Attending: Gynecologic Oncology | Admitting: Gynecologic Oncology

## 2014-11-08 ENCOUNTER — Encounter (HOSPITAL_COMMUNITY): Payer: Self-pay

## 2014-11-08 DIAGNOSIS — J45909 Unspecified asthma, uncomplicated: Secondary | ICD-10-CM | POA: Insufficient documentation

## 2014-11-08 DIAGNOSIS — I10 Essential (primary) hypertension: Secondary | ICD-10-CM | POA: Diagnosis not present

## 2014-11-08 DIAGNOSIS — Z8543 Personal history of malignant neoplasm of ovary: Secondary | ICD-10-CM | POA: Diagnosis not present

## 2014-11-08 DIAGNOSIS — Z9221 Personal history of antineoplastic chemotherapy: Secondary | ICD-10-CM | POA: Diagnosis not present

## 2014-11-08 DIAGNOSIS — C569 Malignant neoplasm of unspecified ovary: Secondary | ICD-10-CM

## 2014-11-08 DIAGNOSIS — Z452 Encounter for adjustment and management of vascular access device: Secondary | ICD-10-CM | POA: Diagnosis not present

## 2014-11-08 LAB — CBC
HCT: 43.5 % (ref 36.0–46.0)
Hemoglobin: 14.6 g/dL (ref 12.0–15.0)
MCH: 31.1 pg (ref 26.0–34.0)
MCHC: 33.6 g/dL (ref 30.0–36.0)
MCV: 92.8 fL (ref 78.0–100.0)
Platelets: 207 10*3/uL (ref 150–400)
RBC: 4.69 MIL/uL (ref 3.87–5.11)
RDW: 12.5 % (ref 11.5–15.5)
WBC: 6.2 10*3/uL (ref 4.0–10.5)

## 2014-11-08 LAB — PROTIME-INR
INR: 0.89 (ref 0.00–1.49)
Prothrombin Time: 12.3 seconds (ref 11.6–15.2)

## 2014-11-08 MED ORDER — FENTANYL CITRATE (PF) 100 MCG/2ML IJ SOLN
INTRAMUSCULAR | Status: AC
  Filled 2014-11-08: qty 2

## 2014-11-08 MED ORDER — MIDAZOLAM HCL 2 MG/2ML IJ SOLN
INTRAMUSCULAR | Status: AC | PRN
  Administered 2014-11-08 (×2): 1 mg via INTRAVENOUS
  Administered 2014-11-08: 0.5 mg via INTRAVENOUS

## 2014-11-08 MED ORDER — CEFAZOLIN SODIUM-DEXTROSE 2-3 GM-% IV SOLR
2.0000 g | Freq: Once | INTRAVENOUS | Status: AC
  Administered 2014-11-08: 2 g via INTRAVENOUS

## 2014-11-08 MED ORDER — SODIUM CHLORIDE 0.9 % IV SOLN
INTRAVENOUS | Status: DC
  Administered 2014-11-08: 500 mL via INTRAVENOUS

## 2014-11-08 MED ORDER — CEFAZOLIN SODIUM-DEXTROSE 2-3 GM-% IV SOLR
INTRAVENOUS | Status: AC
Start: 2014-11-08 — End: 2014-11-08
  Filled 2014-11-08: qty 50

## 2014-11-08 MED ORDER — LIDOCAINE-EPINEPHRINE 2 %-1:100000 IJ SOLN
INTRAMUSCULAR | Status: AC
  Filled 2014-11-08: qty 1

## 2014-11-08 MED ORDER — MIDAZOLAM HCL 2 MG/2ML IJ SOLN
INTRAMUSCULAR | Status: AC
  Filled 2014-11-08: qty 4

## 2014-11-08 MED ORDER — FENTANYL CITRATE (PF) 100 MCG/2ML IJ SOLN
INTRAMUSCULAR | Status: AC | PRN
  Administered 2014-11-08: 50 ug via INTRAVENOUS
  Administered 2014-11-08: 25 ug via INTRAVENOUS

## 2014-11-08 NOTE — Procedures (Signed)
Successful removal of right anterior chest wall port-a-cath. No immediate post procedural complications.    Jay Kylee Nardozzi, MD Pager #: [phone]   

## 2014-11-08 NOTE — H&P (Signed)
Chief Complaint: Patient was seen in consultation today for port Olsen cath removal at the request of Morgan Olsen  Referring Physician(s): Morgan Olsen Dr Morgan Olsen  History of Present Illness: Morgan Olsen is Olsen 54 y.o. female   Pt diagnosed with Ovarian Cancer 2014 Treatment with Dr Morgan Olsen [facility] placed at [facility] 2014 Had originally been told would have it for 5 yrs But MD has requested removal at this time---has had periodic heparin flush without issue Last flush 1 mo ago  Past Medical History  Diagnosis Date  . Hypertension   . Asthma   . Cancer   . Hypertension 08/04/2012  . Exercise-induced asthma 08/04/2012  . Asthma   . Allergic rhinitis   . Ovarian cancer     Past Surgical History  Procedure Laterality Date  . Cesarean section    . Tonsillectomy    . Ablation    . Abdominal hysterectomy  07/15/2012    @ [facility] with oophorectomy  . Appendectomy      Allergies: Sulfa antibiotics  Medications: Prior to Admission medications   Medication Sig Start Date End Date Taking? Authorizing Provider  amLODipine-benazepril (LOTREL) 5-40 MG per capsule Take 1 capsule by mouth daily.   Yes Historical Provider, MD  montelukast (SINGULAIR) 10 MG tablet Take 10 mg by mouth daily.    Yes Historical Provider, MD  Multiple Vitamin (MULTIVITAMIN WITH MINERALS) TABS tablet Take 1 tablet by mouth daily.   Yes Historical Provider, MD  propranolol (INDERAL) 60 MG tablet Take 60 mg by mouth 2 (two) times daily.   Yes Historical Provider, MD     Family History  Problem Relation Age of Onset  . Thyroid cancer Mother   . Hypertension Father   . Stroke Other   . Asthma Other   . Cancer Other     Social History   Social History  . Marital Status: Married    Spouse Name: N/Olsen  . Number of Children: N/Olsen  . Years of Education: N/Olsen   Social History Main Topics  . Smoking status: Never Smoker   . Smokeless tobacco: Never Used  . Alcohol Use: No  . Drug  Use: No  . Sexual Activity: No   Other Topics Concern  . None   Social History Narrative     Review of Systems: Olsen 12 point ROS discussed and pertinent positives are indicated in the HPI above.  All other systems are negative.  Review of Systems  Constitutional: Negative for fever, activity change and fatigue.  Respiratory: Negative for shortness of breath.   Psychiatric/Behavioral: Negative for behavioral problems and confusion.    Vital Signs: Ht 5' 9.5" (1.765 m)  Wt 196 lb (88.905 kg)  BMI 28.54 kg/m2  Physical Exam  Constitutional: She is oriented to person, place, and time. She appears well-nourished.  Neck:  Tender at R PAC site No redness; no infection Always sensitive per pt  Cardiovascular: Normal rate and normal heart sounds.   No murmur heard. Pulmonary/Chest: Effort normal and breath sounds normal. She has no wheezes.  Abdominal: Soft. Bowel sounds are normal. There is no tenderness.  Musculoskeletal: Normal range of motion.  Neurological: She is alert and oriented to person, place, and time.  Skin: Skin is warm and dry.  Psychiatric: She has Olsen normal mood and affect. Her behavior is normal. Judgment and thought content normal.  Nursing note and vitals reviewed.   Mallampati Score:  MD Evaluation Airway: WNL Heart:  WNL Abdomen: WNL Chest/ Lungs: WNL ASA  Classification: 3 Mallampati/Airway Score: One  Imaging: No results found.  Labs:  CBC:  Recent Labs  11/08/14 0810  WBC 6.2  HGB 14.6  HCT 43.5  PLT 207    COAGS: No results for input(s): INR, APTT in the last 8760 hours.  BMP: No results for input(s): NA, K, CL, CO2, GLUCOSE, BUN, CALCIUM, CREATININE, GFRNONAA, GFRAA in the last 8760 hours.  Invalid input(s): CMP  LIVER FUNCTION TESTS: No results for input(s): BILITOT, AST, ALT, ALKPHOS, PROT, ALBUMIN in the last 8760 hours.  TUMOR MARKERS: No results for input(s): AFPTM, CEA, CA199, CHROMGRNA in the last 8760  hours.  Assessment and Plan:  Hx Ovarian Ca 2014 Completed treatment Sept 2014 MD has asked for removal at this point Pt aware of procedure benefits and risks including but not limited to Infection, bleeding; vessel damage and damage to surrounding structures Agreeable and consent signed and in chart  Thank you for this interesting consult.  I greatly enjoyed meeting Morgan Olsen and look forward to participating in their care.  Olsen copy of this report was sent to the requesting provider on this date.  Signed: Amarilis Belflower Olsen 11/08/2014, 8:40 AM   I spent Olsen total of  30 Minutes   in face to face in clinical consultation, greater than 50% of which was counseling/coordinating care for [facility] removal

## 2014-11-08 NOTE — Discharge Instructions (Signed)
Incision Care °An incision is when a surgeon cuts into your body tissues. After surgery, the incision needs to be cared for properly to prevent infection.  °HOME CARE INSTRUCTIONS  °· Take all medicine as directed by your caregiver. Only take over-the-counter or prescription medicines for pain, discomfort, or fever as directed by your caregiver. °· Do not remove your bandage (dressing) or get your incision wet until your surgeon gives you permission. In the event that your dressing becomes wet, dirty, or starts to smell, change the dressing and call your surgeon for instructions as soon as possible. °· Take showers. Do not take tub baths, swim, or do anything that may soak the wound until it is healed. °· Resume your normal diet and activities as directed or allowed. °· Avoid lifting any weight until you are instructed otherwise. °· Use anti-itch antihistamine medicine as directed by your caregiver. The wound may itch when it is healing. Do not pick or scratch at the wound. °· Follow up with your caregiver for stitch (suture) or staple removal as directed. °· Drink enough fluids to keep your urine clear or pale yellow. °SEEK MEDICAL CARE IF:  °· You have redness, swelling, or increasing pain in the wound that is not controlled with medicine. °· You have drainage, blood, or pus coming from the wound that lasts longer than 1 day. °· You develop muscle aches, chills, or a general ill feeling. °· You notice a bad smell coming from the wound or dressing. °· Your wound edges separate after the sutures, staples, or skin adhesive strips have been removed. °· You develop persistent nausea or vomiting. °SEEK IMMEDIATE MEDICAL CARE IF:  °· You have a fever. °· You develop a rash. °· You develop dizzy episodes or faint while standing. °· You have difficulty breathing. °· You develop any reaction or side effects to medicine given. °MAKE SURE YOU:  °· Understand these instructions. °· Will watch your condition. °· Will get help  right away if you are not doing well or get worse. °Document Released: 08/24/2004 Document Revised: 04/29/2011 Document Reviewed: 03/31/2013 °ExitCare® Patient Information ©2015 [employer]. This information is not intended to replace advice given to you by your health care provider. Make sure you discuss any questions you have with your health care provider. °Conscious Sedation °Sedation is the use of medicines to promote relaxation and relieve discomfort and anxiety. Conscious sedation is a type of sedation. Under conscious sedation you are less alert than normal but are still able to respond to instructions or stimulation. Conscious sedation is used during short medical and dental procedures. It is milder than deep sedation or general anesthesia and allows you to return to your regular activities sooner.  °LET YOUR HEALTH CARE PROVIDER KNOW ABOUT:  °· Any allergies you have. °· All medicines you are taking, including vitamins, herbs, eye drops, creams, and over-the-counter medicines. °· Use of steroids (by mouth or creams). °· Previous problems you or members of your family have had with the use of anesthetics. °· Any blood disorders you have. °· Previous surgeries you have had. °· Medical conditions you have. °· Possibility of pregnancy, if this applies. °· Use of cigarettes, alcohol, or illegal drugs. °RISKS AND COMPLICATIONS °Generally, this is a safe procedure. However, as with any procedure, problems can occur. Possible problems include: °· Oversedation. °· Trouble breathing on your own. You may need to have a breathing tube until you are awake and breathing on your own. °· Allergic reaction to any of   the medicines used for the procedure. °BEFORE THE PROCEDURE °· You may have blood tests done. These tests can help show how well your kidneys and liver are working. They can also show how well your blood clots. °· A physical exam will be done.   °· Only take medicines as directed by your health care provider.  You may need to stop taking medicines (such as blood thinners, aspirin, or nonsteroidal anti-inflammatory drugs) before the procedure.   °· Do not eat or drink at least 6 hours before the procedure or as directed by your health care provider. °· Arrange for a responsible adult, family member, or friend to take you home after the procedure. He or she should stay with you for at least 24 hours after the procedure, until the medicine has worn off. °PROCEDURE  °· An intravenous (IV) catheter will be inserted into one of your veins. Medicine will be able to flow directly into your body through this catheter. You may be given medicine through this tube to help prevent pain and help you relax. °· The medical or dental procedure will be done. °AFTER THE PROCEDURE °· You will stay in a recovery area until the medicine has worn off. Your blood pressure and pulse will be checked.   °·  Depending on the procedure you had, you may be allowed to go home when you can tolerate liquids and your pain is under control. °Document Released: 10/30/2000 Document Revised: 02/09/2013 Document Reviewed: 10/12/2012 °ExitCare® Patient Information ©2015 [employer]. This information is not intended to replace advice given to you by your health care provider. Make sure you discuss any questions you have with your health care provider. °Conscious Sedation, Adult, Care After °Refer to this sheet in the next few weeks. These instructions provide you with information on caring for yourself after your procedure. Your health care provider may also give you more specific instructions. Your treatment has been planned according to current medical practices, but problems sometimes occur. Call your health care provider if you have any problems or questions after your procedure. °WHAT TO EXPECT AFTER THE PROCEDURE  °After your procedure: °· You may feel sleepy, clumsy, and have poor balance for several hours. °· Vomiting may occur if you eat too soon  after the procedure. °HOME CARE INSTRUCTIONS °· Do not participate in any activities where you could become injured for at least 24 hours. Do not: °¨ Drive. °¨ Swim. °¨ Ride a bicycle. °¨ Operate heavy machinery. °¨ Cook. °¨ Use power tools. °¨ Climb ladders. °¨ Work from a high place. °· Do not make important decisions or sign legal documents until you are improved. °· If you vomit, drink water, juice, or soup when you can drink without vomiting. Make sure you have little or no nausea before eating solid foods. °· Only take over-the-counter or prescription medicines for pain, discomfort, or fever as directed by your health care provider. °· Make sure you and your family fully understand everything about the medicines given to you, including what side effects may occur. °· You should not drink alcohol, take sleeping pills, or take medicines that cause drowsiness for at least 24 hours. °· If you smoke, do not smoke without supervision. °· If you are feeling better, you may resume normal activities 24 hours after you were sedated. °· Keep all appointments with your health care provider. °SEEK MEDICAL CARE IF: °· Your skin is pale or bluish in color. °· You continue to feel nauseous or vomit. °· Your pain is getting worse   and is not helped by medicine. °· You have bleeding or swelling. °· You are still sleepy or feeling clumsy after 24 hours. °SEEK IMMEDIATE MEDICAL CARE IF: °· You develop a rash. °· You have difficulty breathing. °· You develop any type of allergic problem. °· You have a fever. °MAKE SURE YOU: °· Understand these instructions. °· Will watch your condition. °· Will get help right away if you are not doing well or get worse. °Document Released: 11/25/2012 Document Reviewed: 11/25/2012 °ExitCare® Patient Information ©2015 [employer]. This information is not intended to replace advice given to you by your health care provider. Make sure you discuss any questions you have with your health care  provider. ° °

## 2015-01-09 ENCOUNTER — Telehealth: Payer: Self-pay | Admitting: Gynecologic Oncology

## 2015-01-09 NOTE — Telephone Encounter (Signed)
GYNECOLOGIC ONCOLOGY OFFICE VISIT  CHIEF COMPLAINT: Ovarian carcinsarcoma  surveillance  ASSESSMENT/PLAN:  54 y.o.  with Stage IIB carcinosarcoma of the ovary No evidence of disease since completion of chemotherapy 11/2012 F/U  With Dr. Gaetano Net in 07/2015 Follow-up with Gyn Oncology in 3 months  Genetic counselling   HISTORY OF PRESENT ILLNESS Morgan Olsen is a 54 y.o. woman who initially presented to the ER with pelvic pain.   A CT scan in the ED showed an 11cm complex left ovarian mass with mild mesenteric/omental stranding, small ascites, and peritoneal nodularity. She was referred to Dr. Gertie Fey, who performed a TVUS confirming an 11.5 cm complex left ovarian mass. No free fluid was seen and the uterus was otherwise normal. Her CA-125 was elevated at 1600.  Oncology Summary:  07/15/2012 Surgery TAH, BSO, ovarian cancer staging, appendectomy optimal debulking no gross residual disease  Intraoperative Findings: 10 cm Left ovary with cyst containing old blood and excrescences. The ovarian cyst was already ruptured upon entry to the abdomen. Adenocarcinoma of the ovary on frozen section: cannot rule out non-gyn primary. Left ovarian mass adherent to the uterine fundus and the sigmoid colon mesentery. Left ureter dilated to 1 cm prior to mass removal. Friable, dark soft tissue implants throughout the posterior cul-de-sac and adherent to the appendix. Bilateral pelvic adenopathy, largest 1 cm. Normal-appearing right tube and ovary. No masses noted on the small or large bowel, the mesentery, the liver edge, or the diaphragm. At the end of the case there was no residual disease.  Pathology: Ovary, left, oophorectomy - Carcinosarcoma, homologous type, consisting of mixed endometrioid, clear cell, mucinous, and squamous cell carcinoma components and homologous sarcomatous component.   Received 4 cyles Taxol/Carboplatin and then presented with c/o being unable to grasp and hold objects. Chemotherapy was  then changed to docetaxol/carbo.  Last cycle of taxane/platin administered 11/2012  Lab Results  Component Value Date   CA125 15.6 09/10/2012  OSH CA125 06/13/2014 11   Past Medical History: . Hypertension  . Endometriosis  Identified at the time of cesarean section  . Asthma  . Melanoma in situ s/p excision  . Anxiety  . Ovarian cancer 06/2012   Past Surgical History  Procedure Laterality Date  . Cesarean section    . Tonsillectomy    . Ablation    . Abdominal hysterectomy  07/15/2012    @ [facility] with oophorectomy  . Appendectomy     . Endometrial ablation 2011 For menorrhagia  RESECTION(INITIAL) OVARIAN/TUBAL/PRIMA PERTONL MALIG W/BIL S&O, OMNTECT; W/TAH/LTD PARA-AORTIC LYMPHADENECT:   .COLONOSCOPY, FLEXIBLE, PROXIMAL TO SPLENIC FLEXURE; DIAGNOSTIC, W/WO COLLECTION SPECIMEN BY BRUSH OR [place];  07/17/2012  . Melanoma insitu resection Left 2007 resected from underside of left forearm    Family History  . Colon cancer Other 40  . Uterine cancer Other 54  . Colon cancer Father 57 one polyp  . Cancer Mother Thyroid  . Asthma Mother  . Ovarian cancer Other 66  . Stroke Paternal Grandmother   Social History Daughter moved into  [employer] college on a swim scholarship in the fall.  Is exercising  REVIEW OF SYSTEMS Constitutional  Feels well, more energy, feels like her body is her own Cardiovascular  No chest pain, shortness of breath, or edema  Pulmonary  No cough or wheeze.  Gastro Intestinal  No nausea, vomitting, or diarrhoea. No bright red blood per rectum, No change in bowel movement, or constipation. Abdominal fat weight gain Genito Urinary  No frequency, urgency, dysuria, Musculo Skeletal  No myalgia, arthralgia, joint swelling or pain  Neurologic  No weakness, numbness,  Psychology  No depression, anxiety, insomnia.    PHYSICAL EXAMINATION: There were no vitals taken for this visit. Wt Readings from Last 3 Encounters:  11/08/14 196 lb (88.905 kg)  03/31/14  195 lb 6.4 oz (88.633 kg)  11/29/13 198 lb 9.6 oz (90.084 kg)    WD female in NAD looks happy HEENT: Normal ROM, Sclera non icteric CHEST:  CTA CARDIAC: RRR ABD:  Soft NT, Incision C/D/I no masses, no fluid wave, non tender BACK:  No CVAT LN:  No cervical supraclavicular or inguinal adenopathy PELVIC:  Nl EGBUS, no vaginal bleeding or discharge, atrophic vagina no masses, no cul de sac nodularity Rectal:  Good tone no masses, no nodularity EXT: 1+ edema bilaterally PSYCH:  Appropriate mood affect judgement and reasoning

## 2015-02-02 ENCOUNTER — Ambulatory Visit: Payer: BLUE CROSS/BLUE SHIELD | Attending: Gynecologic Oncology | Admitting: Gynecologic Oncology

## 2015-02-02 VITALS — BP 127/82 | HR 76 | Temp 98.1°F | Resp 18 | Ht 69.5 in | Wt 198.1 lb

## 2015-02-02 DIAGNOSIS — Z8543 Personal history of malignant neoplasm of ovary: Secondary | ICD-10-CM

## 2015-02-02 DIAGNOSIS — H9319 Tinnitus, unspecified ear: Secondary | ICD-10-CM

## 2015-02-02 DIAGNOSIS — C569 Malignant neoplasm of unspecified ovary: Secondary | ICD-10-CM | POA: Diagnosis present

## 2015-02-02 NOTE — Patient Instructions (Signed)
Plan to follow up in three months or sooner if needed.  Please call for any questions or concerns.  Happy Holidays!  Thank you for coming to see me today.  I appreciate your confidence in choosing [facility] for your medical care.  If you have any questions about your visit today, please call our office and we will get back to you as soon as possible.  Dr. Janie Morning Gynecologic Oncology

## 2015-02-02 NOTE — Progress Notes (Addendum)
GYNECOLOGIC ONCOLOGY OFFICE VISIT  CHIEF COMPLAINT: Ovarian carcinsarcoma  surveillance  ASSESSMENT/PLAN:  54 y.o.  with Stage IIB carcinosarcoma of the ovary No evidence of disease since completion of chemotherapy 11/2012 F/U  With Dr. Gaetano Net in 07/2015 Follow-up with Gyn Oncology in 3 months   HISTORY OF PRESENT ILLNESS Morgan Olsen is a 54 y.o. woman who initially presented to the ER with pelvic pain.   A CT scan in the ED showed an 11cm complex left ovarian mass with mild mesenteric/omental stranding, small ascites, and peritoneal nodularity. She was referred to Dr. Gertie Fey, who performed a TVUS confirming an 11.5 cm complex left ovarian mass. No free fluid was seen and the uterus was otherwise normal. Her CA-125 was elevated at 1600.  Oncology Summary:  07/15/2012 Surgery TAH, BSO, ovarian cancer staging, appendectomy optimal debulking no gross residual disease  Intraoperative Findings: 10 cm Left ovary with cyst containing old blood and excrescences. The ovarian cyst was already ruptured upon entry to the abdomen. Adenocarcinoma of the ovary on frozen section: cannot rule out non-gyn primary. Left ovarian mass adherent to the uterine fundus and the sigmoid colon mesentery. Left ureter dilated to 1 cm prior to mass removal. Friable, dark soft tissue implants throughout the posterior cul-de-sac and adherent to the appendix. Bilateral pelvic adenopathy, largest 1 cm. Normal-appearing right tube and ovary. No masses noted on the small or large bowel, the mesentery, the liver edge, or the diaphragm. At the end of the case there was no residual disease.  Pathology: Ovary, left, oophorectomy - Carcinosarcoma, homologous type, consisting of mixed endometrioid, clear cell, mucinous, and squamous cell carcinoma components and homologous sarcomatous component.   Immediately post op underwent upper and lower endoscopy that was negative.    Genetic testing notable for an unusual mutation of unknown  significance.  Received 4 cyles Taxol/Carboplatin and then presented with c/o being unable to grasp and hold objects. Chemotherapy was then changed to docetaxol/carbo.  Last cycle of taxane/platin administered 11/2012  Lab Results  Component Value Date   CA125 15.6 09/10/2012  OSH CA125 06/13/2014 11   Past Medical History: . Hypertension  . Endometriosis  Identified at the time of cesarean section  . Asthma  . Melanoma in situ s/p excision  . Anxiety  . Ovarian cancer 06/2012   Past Surgical History  Procedure Laterality Date  . Cesarean section    . Tonsillectomy    . Ablation    . Abdominal hysterectomy  07/15/2012    @ [facility] with oophorectomy  . Appendectomy     . Endometrial ablation 2011 For menorrhagia  RESECTION(INITIAL) OVARIAN/TUBAL/PRIMA PERTONL MALIG W/BIL S&O, OMNTECT; W/TAH/LTD PARA-AORTIC LYMPHADENECT:   .COLONOSCOPY, FLEXIBLE, PROXIMAL TO SPLENIC FLEXURE; DIAGNOSTIC, W/WO COLLECTION SPECIMEN BY BRUSH OR [place];  07/17/2012  . Melanoma insitu resection Left 2007 resected from underside of left forearm    Family History  . Colon cancer Other 47  . Uterine cancer Other 54  . Colon cancer Father 9 one polyp  . Cancer Mother Thyroid  . Asthma Mother  . Ovarian cancer Other 22  . Stroke Paternal Grandmother   Social History Daughter moved into  [employer] college on a swim scholarship grades are soso. Husband is well.  REVIEW OF SYSTEMS Constitutional  Feels well,  Cardiovascular  No chest pain, shortness of breath, or edema  Pulmonary  No cough or wheeze.  Gastro Intestinal  No nausea, vomitting, or diarrhoea. No bright red blood per rectum, No change in bowel movement, or constipation.  Genito Urinary  No frequency, urgency, dysuria, no vaginal bleeding Musculo Skeletal  No myalgia, arthralgia, joint swelling or pain  Neurologic  No weakness, numbness, residual tinnitus Psychology  No depression, anxiety,   PHYSICAL EXAMINATION: BP 127/82 mmHg  Pulse  76  Temp(Src) 98.1 F (36.7 C) (Oral)  Resp 18  Ht 5' 9.5" (1.765 m)  Wt 198 lb 1.6 oz (89.858 kg)  BMI 28.84 kg/m2  SpO2 100% Wt Readings from Last 3 Encounters:  02/02/15 198 lb 1.6 oz (89.858 kg)  11/08/14 196 lb (88.905 kg)  03/31/14 195 lb 6.4 oz (88.633 kg)  WD female in NAD looks happy HEENT: Normal ROM, Sclera non icteric CHEST:  CTA CARDIAC: RRR ABD:  Soft NT, Incision C/D/I no masses, no fluid wave, non tender BACK:  No CVAT LN:  No cervical supraclavicular or inguinal adenopathy PELVIC:  Nl EGBUS, no vaginal bleeding or discharge, atrophic vagina no masses, no cul de sac nodularity Rectal:  Good tone no masses, no nodularity EXT: 1+ edema bilaterally PSYCH:  Appropriate mood affect

## 2015-04-20 ENCOUNTER — Encounter: Payer: Self-pay | Admitting: Gynecologic Oncology

## 2015-04-20 ENCOUNTER — Ambulatory Visit: Payer: BLUE CROSS/BLUE SHIELD | Attending: Gynecologic Oncology | Admitting: Gynecologic Oncology

## 2015-04-20 VITALS — BP 132/84 | HR 73 | Temp 97.7°F | Resp 20 | Ht 69.5 in | Wt 200.6 lb

## 2015-04-20 DIAGNOSIS — Z823 Family history of stroke: Secondary | ICD-10-CM | POA: Insufficient documentation

## 2015-04-20 DIAGNOSIS — I10 Essential (primary) hypertension: Secondary | ICD-10-CM | POA: Diagnosis not present

## 2015-04-20 DIAGNOSIS — C562 Malignant neoplasm of left ovary: Secondary | ICD-10-CM | POA: Insufficient documentation

## 2015-04-20 DIAGNOSIS — Z85820 Personal history of malignant melanoma of skin: Secondary | ICD-10-CM | POA: Diagnosis not present

## 2015-04-20 DIAGNOSIS — N809 Endometriosis, unspecified: Secondary | ICD-10-CM | POA: Insufficient documentation

## 2015-04-20 DIAGNOSIS — Z800 Family history of malignant neoplasm of digestive organs: Secondary | ICD-10-CM | POA: Diagnosis not present

## 2015-04-20 DIAGNOSIS — Z9221 Personal history of antineoplastic chemotherapy: Secondary | ICD-10-CM | POA: Insufficient documentation

## 2015-04-20 DIAGNOSIS — J45909 Unspecified asthma, uncomplicated: Secondary | ICD-10-CM | POA: Diagnosis not present

## 2015-04-20 DIAGNOSIS — Z8543 Personal history of malignant neoplasm of ovary: Secondary | ICD-10-CM | POA: Diagnosis not present

## 2015-04-20 DIAGNOSIS — Z8041 Family history of malignant neoplasm of ovary: Secondary | ICD-10-CM | POA: Insufficient documentation

## 2015-04-20 DIAGNOSIS — F419 Anxiety disorder, unspecified: Secondary | ICD-10-CM | POA: Diagnosis not present

## 2015-04-20 DIAGNOSIS — Z808 Family history of malignant neoplasm of other organs or systems: Secondary | ICD-10-CM | POA: Diagnosis not present

## 2015-04-20 DIAGNOSIS — R1032 Left lower quadrant pain: Secondary | ICD-10-CM | POA: Diagnosis not present

## 2015-04-20 DIAGNOSIS — C569 Malignant neoplasm of unspecified ovary: Secondary | ICD-10-CM

## 2015-04-20 NOTE — Progress Notes (Signed)
GYNECOLOGIC ONCOLOGY OFFICE VISIT  CHIEF COMPLAINT: Ovarian carcinsarcoma  Surveillance, worsening left lower quadrant discomfort  ASSESSMENT/PLAN:  55 y.o.  with Stage IIB carcinosarcoma of the ovary No evidence of disease since completion of chemotherapy 11/2012 F/U  With Dr. Gaetano Net in 07/2015 Follow-up with Gyn Oncology in 3 months  Left lower quadrant pain This discomfort was evaluated over a year ago. Morgan Olsen now reports that the discomfort over the last 2 weeks is increasing and caliber. CT of the abdomen and pelvis.    HISTORY OF PRESENT ILLNESS Morgan Olsen is a 55 y.o. woman who initially presented to the ER with pelvic pain.   A CT scan in the ED showed an 11cm complex left ovarian mass with mild mesenteric/omental stranding, small ascites, and peritoneal nodularity. She was referred to Dr. Gertie Fey, who performed a TVUS confirming an 11.5 cm complex left ovarian mass. No free fluid was seen and the uterus was otherwise normal. Her CA-125 was elevated at 1600.  Oncology Summary:  07/15/2012 Surgery TAH, BSO, ovarian cancer staging, appendectomy optimal debulking no gross residual disease  Intraoperative Findings: 10 cm Left ovary with cyst containing old blood and excrescences. The ovarian cyst was already ruptured upon entry to the abdomen. Adenocarcinoma of the ovary on frozen section: cannot rule out non-gyn primary. Left ovarian mass adherent to the uterine fundus and the sigmoid colon mesentery. Left ureter dilated to 1 cm prior to mass removal. Friable, dark soft tissue implants throughout the posterior cul-de-sac and adherent to the appendix. Bilateral pelvic adenopathy, largest 1 cm. Normal-appearing right tube and ovary. No masses noted on the small or large bowel, the mesentery, the liver edge, or the diaphragm. At the end of the case there was no residual disease.  Pathology: Ovary, left, oophorectomy - Carcinosarcoma, homologous type, consisting of mixed endometrioid, clear  cell, mucinous, and squamous cell carcinoma components and homologous sarcomatous component.   Immediately post op underwent upper and lower endoscopy that was negative.    Genetic testing notable for an unusual mutation of unknown significance.  Received 4 cyles Taxol/Carboplatin and then presented with c/o being unable to grasp and hold objects. Chemotherapy was then changed to docetaxol/carbo.  Last cycle of taxane/platin administered 11/2012  Lab Results  Component Value Date   CA125 15.6 09/10/2012  OSH CA125 06/13/2014 11   Morgan Olsen now reports that the discomfort over the last 2 weeks is increasing and caliber. There is no radiation there is no other associated symptoms.  No weight loss no change in appetite no early satiety no abdominal bloating   Past Medical History: . Hypertension  . Endometriosis  Identified at the time of cesarean section  . Asthma  . Melanoma in situ s/p excision  . Anxiety  . Ovarian cancer 06/2012   Past Surgical History  Procedure Laterality Date  . Cesarean section    . Tonsillectomy    . Ablation    . Abdominal hysterectomy  07/15/2012    @ [facility] with oophorectomy  . Appendectomy     . Endometrial ablation 2011 For menorrhagia  RESECTION(INITIAL) OVARIAN/TUBAL/PRIMA PERTONL MALIG W/BIL S&O, OMNTECT; W/TAH/LTD PARA-AORTIC LYMPHADENECT:   .COLONOSCOPY, FLEXIBLE, PROXIMAL TO SPLENIC FLEXURE; DIAGNOSTIC, W/WO COLLECTION SPECIMEN BY BRUSH OR [place];  07/17/2012  . Melanoma insitu resection Left 2007 resected from underside of left forearm    Family History  . Colon cancer Other 63  . Uterine cancer Other 54  . Colon cancer Father 106 one polyp  . Cancer Mother Thyroid  .  Asthma Mother  . Ovarian cancer Other 62  . Stroke Paternal Grandmother   Social History Daughter moved into  [employer] college on a swim scholarship grades are soso. Husband is well. Has restarted Weight Watchers  REVIEW OF SYSTEMS Constitutional  Feels well,  Cardiovascular   No chest pain, shortness of breath, or edema  Pulmonary  No cough or wheeze.  Gastro Intestinal  No nausea, vomitting, or diarrhoea. No bright red blood per rectum, No change in bowel movement, or constipation. The left lower quadrant discomfort that has been present since surgery is now increasing in intensity. No associated nausea or vomiting Genito Urinary  No frequency, urgency, dysuria, no vaginal bleeding Musculo Skeletal  No myalgia, arthralgia, joint swelling or pain  Neurologic  No weakness, numbness, residual tinnitus Psychology  No depression, anxiety,   PHYSICAL EXAMINATION: BP 132/84 mmHg  Pulse 73  Temp(Src) 97.7 F (36.5 C) (Oral)  Resp 20  Ht 5' 9.5" (1.765 m)  Wt 200 lb 9.6 oz (90.992 kg)  BMI 29.21 kg/m2  SpO2 98% Wt Readings from Last 3 Encounters:  04/20/15 200 lb 9.6 oz (90.992 kg)  02/02/15 198 lb 1.6 oz (89.858 kg)  11/08/14 196 lb (88.905 kg)  WD female in NAD looks happy HEENT: Normal ROM, Sclera non icteric CHEST:  CTA CARDIAC: RRR ABD:  Soft NT, Incision C/D/I no masses, no fluid wave, non tender. No rebound or guarding BACK:  No CVAT LN:  No cervical supraclavicular or inguinal adenopathy PELVIC:  Nl EGBUS, no vaginal bleeding or discharge, atrophic vagina no masses, no cul de sac nodularity Rectal:  Good tone no masses, no nodularity EXT: 1+ edema bilaterally PSYCH:  Appropriate mood affect

## 2015-04-20 NOTE — Patient Instructions (Signed)
Plan to have a CT scan of the abdomen and pelvis for evaluation of change in left lower quadrant pain.  Plan to follow up with Dr. Skeet Latch in three months or sooner if needed.

## 2015-04-27 ENCOUNTER — Ambulatory Visit (HOSPITAL_COMMUNITY)
Admission: RE | Admit: 2015-04-27 | Discharge: 2015-04-27 | Disposition: A | Discharge status: Home / Self Care | Payer: BLUE CROSS/BLUE SHIELD | Source: Ambulatory Visit | Attending: Gynecologic Oncology | Admitting: Gynecologic Oncology

## 2015-04-27 ENCOUNTER — Telehealth: Payer: Self-pay | Admitting: Gynecologic Oncology

## 2015-04-27 ENCOUNTER — Encounter (HOSPITAL_COMMUNITY): Payer: Self-pay

## 2015-04-27 DIAGNOSIS — R1032 Left lower quadrant pain: Secondary | ICD-10-CM | POA: Insufficient documentation

## 2015-04-27 DIAGNOSIS — K449 Diaphragmatic hernia without obstruction or gangrene: Secondary | ICD-10-CM | POA: Insufficient documentation

## 2015-04-27 DIAGNOSIS — C569 Malignant neoplasm of unspecified ovary: Secondary | ICD-10-CM | POA: Insufficient documentation

## 2015-04-27 DIAGNOSIS — I700 Atherosclerosis of aorta: Secondary | ICD-10-CM | POA: Insufficient documentation

## 2015-04-27 DIAGNOSIS — Z90710 Acquired absence of both cervix and uterus: Secondary | ICD-10-CM | POA: Diagnosis not present

## 2015-04-27 DIAGNOSIS — M899 Disorder of bone, unspecified: Secondary | ICD-10-CM

## 2015-04-27 MED ORDER — IOHEXOL 300 MG/ML  SOLN
100.0000 mL | Freq: Once | INTRAMUSCULAR | Status: AC | PRN
  Administered 2015-04-27: 100 mL via INTRAVENOUS

## 2015-04-27 NOTE — Telephone Encounter (Signed)
Patient informed of CT scan results and Dr. Leone Brand recommendations to proceed with the recommended MRI of the spine with and without contrast.

## 2015-05-05 ENCOUNTER — Ambulatory Visit (HOSPITAL_COMMUNITY)
Admission: RE | Admit: 2015-05-05 | Discharge: 2015-05-05 | Disposition: A | Discharge status: Home / Self Care | Payer: BLUE CROSS/BLUE SHIELD | Source: Ambulatory Visit | Attending: Gynecologic Oncology | Admitting: Gynecologic Oncology

## 2015-05-05 ENCOUNTER — Telehealth: Payer: Self-pay | Admitting: Gynecologic Oncology

## 2015-05-05 DIAGNOSIS — Z8543 Personal history of malignant neoplasm of ovary: Secondary | ICD-10-CM | POA: Diagnosis not present

## 2015-05-05 DIAGNOSIS — M5134 Other intervertebral disc degeneration, thoracic region: Secondary | ICD-10-CM | POA: Diagnosis not present

## 2015-05-05 DIAGNOSIS — M5136 Other intervertebral disc degeneration, lumbar region: Secondary | ICD-10-CM | POA: Diagnosis not present

## 2015-05-05 DIAGNOSIS — M899 Disorder of bone, unspecified: Secondary | ICD-10-CM

## 2015-05-05 MED ORDER — GADOBENATE DIMEGLUMINE 529 MG/ML IV SOLN
20.0000 mL | Freq: Once | INTRAVENOUS | Status: AC | PRN
  Administered 2015-05-05: 19 mL via INTRAVENOUS

## 2015-05-05 NOTE — Telephone Encounter (Signed)
Left message asking the patient to call the office to discuss MRI results.

## 2015-05-08 ENCOUNTER — Telehealth: Payer: Self-pay | Admitting: Gynecologic Oncology

## 2015-05-08 NOTE — Telephone Encounter (Signed)
Left message for the patient to call the office to discuss Dr. Leone Brand recommendations.

## 2015-05-09 ENCOUNTER — Telehealth: Payer: Self-pay | Admitting: Gynecologic Oncology

## 2015-05-09 DIAGNOSIS — D1809 Hemangioma of other sites: Secondary | ICD-10-CM

## 2015-05-09 NOTE — Telephone Encounter (Addendum)
Per Dr. Skeet Latch, referral to neurosurgery placed due to aggressive hemangioma.  Pt informed of her recommendations as well.  Referral placed to Dr. Erline Levine with [facility].  Records with referral faxed to [phone].

## 2015-05-11 ENCOUNTER — Telehealth: Payer: Self-pay | Admitting: Gynecologic Oncology

## 2015-05-11 NOTE — Telephone Encounter (Signed)
Called and left message for new patient referrals coordinator to make sure that she received the records for the patient for a new pt referral to Dr. Vertell Limber.

## 2015-05-16 DIAGNOSIS — D1809 Hemangioma of other sites: Secondary | ICD-10-CM | POA: Insufficient documentation

## 2015-08-17 ENCOUNTER — Encounter: Payer: Self-pay | Admitting: Gynecologic Oncology

## 2015-08-17 ENCOUNTER — Ambulatory Visit: Payer: BLUE CROSS/BLUE SHIELD | Attending: Gynecologic Oncology | Admitting: Gynecologic Oncology

## 2015-08-17 VITALS — BP 128/81 | HR 71 | Temp 98.0°F | Resp 19 | Ht 69.5 in | Wt 193.1 lb

## 2015-08-17 DIAGNOSIS — I10 Essential (primary) hypertension: Secondary | ICD-10-CM | POA: Diagnosis not present

## 2015-08-17 DIAGNOSIS — Z9889 Other specified postprocedural states: Secondary | ICD-10-CM | POA: Diagnosis not present

## 2015-08-17 DIAGNOSIS — J45909 Unspecified asthma, uncomplicated: Secondary | ICD-10-CM | POA: Diagnosis not present

## 2015-08-17 DIAGNOSIS — D1809 Hemangioma of other sites: Secondary | ICD-10-CM | POA: Insufficient documentation

## 2015-08-17 DIAGNOSIS — C569 Malignant neoplasm of unspecified ovary: Secondary | ICD-10-CM

## 2015-08-17 DIAGNOSIS — Z8543 Personal history of malignant neoplasm of ovary: Secondary | ICD-10-CM | POA: Diagnosis not present

## 2015-08-17 DIAGNOSIS — R509 Fever, unspecified: Secondary | ICD-10-CM | POA: Diagnosis not present

## 2015-08-17 DIAGNOSIS — F419 Anxiety disorder, unspecified: Secondary | ICD-10-CM | POA: Insufficient documentation

## 2015-08-17 DIAGNOSIS — E669 Obesity, unspecified: Secondary | ICD-10-CM

## 2015-08-17 DIAGNOSIS — R102 Pelvic and perineal pain: Secondary | ICD-10-CM | POA: Insufficient documentation

## 2015-08-17 NOTE — Patient Instructions (Signed)
Plan to follow up in Sept or sooner if needed.  Please call for any questions or concerns.

## 2015-08-17 NOTE — Progress Notes (Signed)
GYNECOLOGIC ONCOLOGY OFFICE VISIT  CHIEF COMPLAINT: Ovarian carcinsarcoma  Surveillance,   ASSESSMENT/PLAN:  55 y.o.  with Stage IIB carcinosarcoma of the ovary No evidence of disease since completion of chemotherapy 11/2012 Follow-up with Gyn Oncology in 3 months  Spinal hemangioma Followed by neurosurgery.   HISTORY OF PRESENT ILLNESS Morgan Olsen is a 55 y.o. woman who initially presented to the ER with pelvic pain.   A CT scan in the ED showed an 11cm complex left ovarian mass with mild mesenteric/omental stranding, small ascites, and peritoneal nodularity. She was referred to Morgan Olsen, who performed a TVUS confirming an 11.5 cm complex left ovarian mass. No free fluid was seen and the uterus was otherwise normal. Her CA-125 was elevated at 1600.  Oncology Summary:  07/15/2012 Surgery TAH, BSO, ovarian cancer staging, appendectomy optimal debulking no gross residual disease  Intraoperative Findings: 10 cm Left ovary with cyst containing old blood and excrescences. The ovarian cyst was already ruptured upon entry to the abdomen. Adenocarcinoma of the ovary on frozen section: cannot rule out non-gyn primary. Left ovarian mass adherent to the uterine fundus and the sigmoid colon mesentery. Left ureter dilated to 1 cm prior to mass removal. Friable, dark soft tissue implants throughout the posterior cul-de-sac and adherent to the appendix. Bilateral pelvic adenopathy, largest 1 cm. Normal-appearing right tube and ovary. No masses noted on the small or large bowel, the mesentery, the liver edge, or the diaphragm. At the end of the case there was no residual disease.  Pathology: Ovary, left, oophorectomy - Carcinosarcoma, homologous type, consisting of mixed endometrioid, clear cell, mucinous, and squamous cell carcinoma components and homologous sarcomatous component.   Immediately post op underwent upper and lower endoscopy that was negative.    Genetic testing notable for an unusual mutation  of unknown significance.  Received 4 cyles Taxol/Carboplatin and then presented with c/o being unable to grasp and hold objects. Chemotherapy was then changed to docetaxol/carbo.  Last cycle of taxane/platin administered 11/2012  Lab Results  Component Value Date   CA125 15.6 09/10/2012  OSH CA125 06/13/2014 11   Morgan Olsen 04/2015 reported LLQ the discomfort increasing and caliber. There is no radiation there is no other associated symptoms.  No weight loss no change in appetite no early satiety no abdominal bloating  CT 04/2015 IMPRESSION: 1. No evidence of soft tissue metastasis or explanation for left lower quadrant pain. 2. Enlargement of a heterogeneous, partially sclerotic T12 lesion. Morphology suggests at this may be a hemangioma. However, development since 2014 would be atypical. Isolated slow growing osseous metastasis from ovarian carcinoma would be unlikely. Consider further characterization with pre and postcontrast spine  MRI or bone scan.  Left lower quadrant discomfort spontaneously resolved without fever chills or diarrhea.  Feels very well and is entirely without complaints  Past Medical History: . Hypertension  . Endometriosis  Identified at the time of cesarean section  . Asthma  . Melanoma in situ s/p excision  . Anxiety   . Ovarian cancer 06/2012   Past Surgical History  Procedure Laterality Date  . Cesarean section    . Tonsillectomy    . Ablation    . Abdominal hysterectomy  07/15/2012    @ [facility] with oophorectomy  . Appendectomy     . Endometrial ablation 2011 For menorrhagia  RESECTION(INITIAL) OVARIAN/TUBAL/PRIMA PERTONL MALIG W/BIL S&O, OMNTECT; W/TAH/LTD PARA-AORTIC LYMPHADENECT:   .COLONOSCOPY, FLEXIBLE, PROXIMAL TO SPLENIC FLEXURE; DIAGNOSTIC, W/WO COLLECTION SPECIMEN BY BRUSH OR [place];  07/17/2012  . Melanoma  insitu resection Left 2007 resected from underside of left forearm    Family History  . Colon cancer Other 20  . Uterine cancer Other 54  .  Colon cancer Father 91 one polyp  . Cancer Mother Thyroid  . Asthma Mother  . Ovarian cancer Other 41  . Stroke Paternal Grandmother   Social History Daughter moved into  [employer] college on a swim scholarship grades are soso. Husband is well. Has restarted Weight Watchers  REVIEW OF SYSTEMS Constitutional  Feels well,  Cardiovascular  No chest pain, shortness of breath, or edema  Pulmonary  No cough or wheeze.  Gastro Intestinal  No nausea, vomitting, or diarrhoea. No bright red blood per rectum, No change in bowel movement, or constipation. The left lower quadrant discomfort that has been present since surgery is now increasing in intensity. No associated nausea or vomiting Genito Urinary  No frequency, urgency, dysuria, no vaginal bleeding Musculo Skeletal  No myalgia, arthralgia, joint swelling or pain  Neurologic  No weakness, numbness, residual tinnitus Psychology  No depression, anxiety,   PHYSICAL EXAMINATION: BP 128/81 mmHg  Pulse 71  Temp(Src) 98 F (36.7 C) (Oral)  Resp 19  Ht 5' 9.5" (1.765 m)  Wt 193 lb 1.6 oz (87.59 kg)  BMI 28.12 kg/m2  SpO2 100% Wt Readings from Last 3 Encounters:  04/20/15 200 lb 9.6 oz (90.992 kg)  02/02/15 198 lb 1.6 oz (89.858 kg)  11/08/14 196 lb (88.905 kg)  WD female in NAD looks happy HEENT: Normal ROM, Sclera non icteric CHEST:  CTA CARDIAC: RRR ABD:  Soft NT, Incision C/D/I no masses, no fluid wave, non tender. No rebound or guarding BACK:  No CVAT LN:  No cervical supraclavicular or inguinal adenopathy PELVIC:  Nl EGBUS, no vaginal bleeding or discharge, atrophic vagina no masses, no cul de sac nodularity Rectal:  Good tone no masses, no nodularity EXT: 1+ edema bilaterally PSYCH:  Appropriate mood affect

## 2015-11-16 ENCOUNTER — Encounter: Payer: Self-pay | Admitting: Gynecologic Oncology

## 2015-11-16 ENCOUNTER — Ambulatory Visit: Payer: BLUE CROSS/BLUE SHIELD | Attending: Gynecologic Oncology | Admitting: Gynecologic Oncology

## 2015-11-16 VITALS — BP 122/73 | HR 76 | Temp 97.7°F | Resp 18 | Wt 193.6 lb

## 2015-11-16 DIAGNOSIS — E669 Obesity, unspecified: Secondary | ICD-10-CM

## 2015-11-16 DIAGNOSIS — Z808 Family history of malignant neoplasm of other organs or systems: Secondary | ICD-10-CM | POA: Diagnosis not present

## 2015-11-16 DIAGNOSIS — C569 Malignant neoplasm of unspecified ovary: Secondary | ICD-10-CM | POA: Insufficient documentation

## 2015-11-16 DIAGNOSIS — Z8041 Family history of malignant neoplasm of ovary: Secondary | ICD-10-CM | POA: Diagnosis not present

## 2015-11-16 DIAGNOSIS — Z8059 Family history of malignant neoplasm of other urinary tract organ: Secondary | ICD-10-CM | POA: Insufficient documentation

## 2015-11-16 DIAGNOSIS — R1032 Left lower quadrant pain: Secondary | ICD-10-CM | POA: Diagnosis not present

## 2015-11-16 DIAGNOSIS — Z800 Family history of malignant neoplasm of digestive organs: Secondary | ICD-10-CM | POA: Insufficient documentation

## 2015-11-16 NOTE — Patient Instructions (Signed)
Plan to follow up in six months or sooner if needed.  Please call in November or December 2017 to schedule.

## 2015-11-16 NOTE — Progress Notes (Signed)
GYNECOLOGIC ONCOLOGY OFFICE VISIT  CHIEF COMPLAINT: Ovarian carcinsarcoma  Surveillance,   ASSESSMENT/PLAN:  55 y.o.  with Stage IIB carcinosarcoma of the ovary No evidence of disease since completion of chemotherapy 11/2012 Follow-up with Gyn Oncology in 6 months    HISTORY OF PRESENT ILLNESS Morgan Olsen is a 55 y.o. woman who initially presented to the ER with pelvic pain.   A CT scan in the ED showed an 11cm complex left ovarian mass with mild mesenteric/omental stranding, small ascites, and peritoneal nodularity. She was referred to Morgan Olsen, who performed a TVUS confirming an 11.5 cm complex left ovarian mass. No free fluid was seen and the uterus was otherwise normal. Her CA-125 was elevated at 1600.  Oncology Summary:  07/15/2012 Surgery TAH, BSO, ovarian cancer staging, appendectomy optimal debulking no gross residual disease  Intraoperative Findings: 10 cm Left ovary with cyst containing old blood and excrescences. The ovarian cyst was already ruptured upon entry to the abdomen. Adenocarcinoma of the ovary on frozen section: cannot rule out non-gyn primary. Left ovarian mass adherent to the uterine fundus and the sigmoid colon mesentery. Left ureter dilated to 1 cm prior to mass removal. Friable, dark soft tissue implants throughout the posterior cul-de-sac and adherent to the appendix. Bilateral pelvic adenopathy, largest 1 cm. Normal-appearing right tube and ovary. No masses noted on the small or large bowel, the mesentery, the liver edge, or the diaphragm. At the end of the case there was no residual disease.  Pathology: Ovary, left, oophorectomy - Carcinosarcoma, homologous type, consisting of mixed endometrioid, clear cell, mucinous, and squamous cell carcinoma components and homologous sarcomatous component.   Immediately post op underwent upper and lower endoscopy that was negative.    Genetic testing notable for an unusual mutation of unknown significance.  Received 4 cyles  Taxol/Carboplatin and then presented with c/o being unable to grasp and hold objects. Chemotherapy was then changed to docetaxol/carbo.  Last cycle of taxane/platin administered 11/2012  Lab Results  Component Value Date   CA125 15.6 09/10/2012  OSH CA125 06/13/2014 11   Morgan Olsen 04/2015 reported LLQ the discomfort increasing and caliber. There is no radiation there is no other associated symptoms.  No weight loss no change in appetite no early satiety no abdominal bloating  CT 04/2015 IMPRESSION: 1. No evidence of soft tissue metastasis or explanation for left lower quadrant pain. 2. Enlargement of a heterogeneous, partially sclerotic T12 lesion. Morphology suggests at this may be a hemangioma. However, development since 2014 would be atypical. Isolated slow growing osseous metastasis from ovarian carcinoma would be unlikely. Consider further characterization with pre and postcontrast spine  MRI or bone scan.  Left lower quadrant discomfort spontaneously resolved without fever chills or diarrhea.  Feels very well and is entirely without complaints  Past Medical History: . Hypertension  . Endometriosis  Identified at the time of cesarean section  . Asthma  . Melanoma in situ s/p excision  . Anxiety   . Ovarian cancer 06/2012   Past Surgical History:  Procedure Laterality Date  . ABDOMINAL HYSTERECTOMY  07/15/2012   @ [facility] with oophorectomy  . ABLATION    . APPENDECTOMY    . CESAREAN SECTION    . TONSILLECTOMY     . Endometrial ablation 2011 For menorrhagia  RESECTION(INITIAL) OVARIAN/TUBAL/PRIMA PERTONL MALIG W/BIL S&O, OMNTECT; W/TAH/LTD PARA-AORTIC LYMPHADENECT:   .COLONOSCOPY, FLEXIBLE, PROXIMAL TO SPLENIC FLEXURE; DIAGNOSTIC, W/WO COLLECTION SPECIMEN BY BRUSH OR [place];  07/17/2012  . Melanoma insitu resection Left 2007 resected from  underside of left forearm    Family History  . Colon cancer Other 61  . Uterine cancer Other 54  . Colon cancer Father 47 one polyp  . Cancer  Mother Thyroid  . Asthma Mother  . Ovarian cancer Other 66  . Stroke Paternal Grandmother   Social History Daughter moved into  [employer] college on a swim scholarship grades are soso.Will major in IT sales professional. Husband is well.    REVIEW OF SYSTEMS Constitutional  Feels well,  Cardiovascular  No chest pain, shortness of breath, or edema  Pulmonary  No cough or wheeze.  Gastro Intestinal  No nausea, vomitting, or diarrhoea. No bright red blood per rectum, No change in bowel movement, or constipation.  Genito Urinary  No frequency, urgency, dysuria, no vaginal bleeding Musculo Skeletal  No myalgia, arthralgia, joint swelling or pain  Neurologic  No weakness, numbness, residual tinnitus Psychology  No depression, anxiety,   PHYSICAL EXAMINATION: BP 122/73 (BP Location: Left Arm, Patient Position: Sitting)   Pulse 76   Temp 97.7 F (36.5 C) (Oral)   Resp 18   Wt 193 lb 9.6 oz (87.8 kg)   SpO2 100%   BMI 28.18 kg/m  Wt Readings from Last 3 Encounters:  11/16/15 193 lb 9.6 oz (87.8 kg)  08/17/15 193 lb 1.6 oz (87.6 kg)  04/20/15 200 lb 9.6 oz (91 kg)  WD female in NAD looks happy HEENT: Normal ROM, Sclera non icteric CHEST:  CTA CARDIAC: RRR ABD:  Soft NT, Incision C/D/I no masses, no fluid wave, non tender. No rebound or guarding BACK:  No CVAT LN:  No cervical supraclavicular or inguinal adenopathy PELVIC:  Nl EGBUS, no vaginal bleeding or discharge, atrophic vagina no masses, no cul de sac nodularity Rectal:  Good tone no masses, no nodularity EXT: 1+ edema bilaterally PSYCH:  Appropriate mood affect

## 2016-02-23 ENCOUNTER — Other Ambulatory Visit: Payer: Self-pay | Admitting: Nurse Practitioner

## 2016-05-13 IMAGING — MR MR LUMBAR SPINE WO/W CM
6 of 15 series · 17 of 48 positions shown · IV contrast (multihance)
Comparison: CT Abdomen and Pelvis 04/27/2015 and earlier

CLINICAL DATA: 54-year-old female with ovarian cancer and enlarging
T12 bone lesion which seems to be new since 3449 by CT. Staging.
Subsequent encounter.

EXAM:
MRI THORACIC AND LUMBAR SPINE WITHOUT AND WITH CONTRAST
TECHNIQUE: Multiplanar and multiecho pulse sequences of the thoracic and lumbar
spine were obtained without and with intravenous contrast.
CONTRAST:  19mL MULTIHANCE GADOBENATE DIMEGLUMINE 529 MG/ML IV SOLN

[Series 5: T1 · sagittal · 3.0mm · 0.64mm/px · 1 of 13 slices shown (1 of 2)]
[im 1/13]
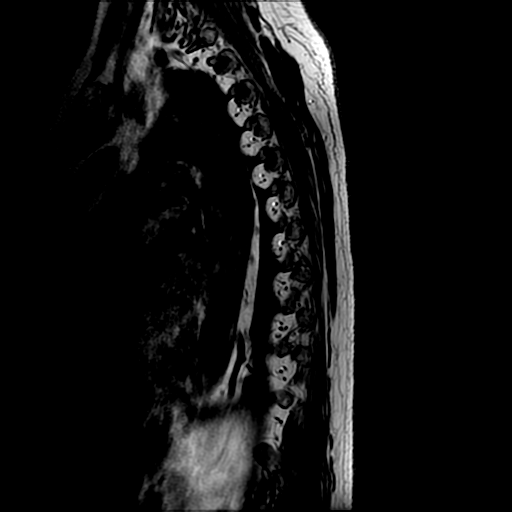

[Series 7: T2 · axial · 4.0mm · 0.39mm/px · z∈[-361,-94]mm · 5 of 49 slices shown (1 of 2)]
[im 1/49]
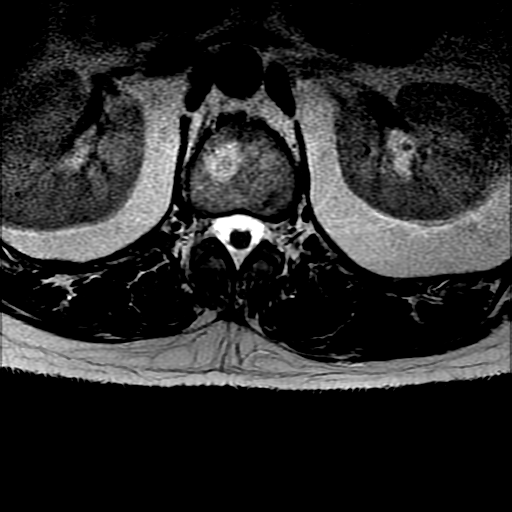
[im 13/49]
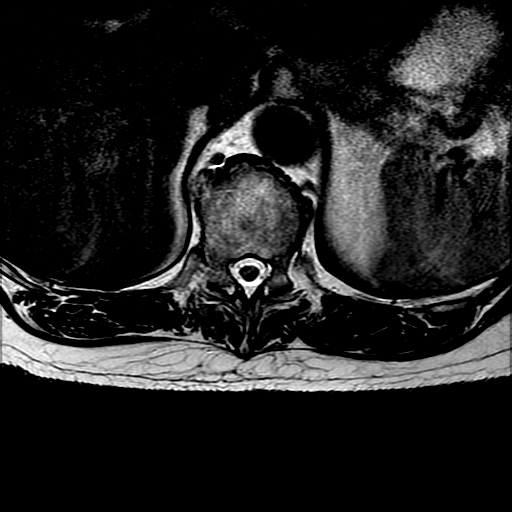
[im 25/49]
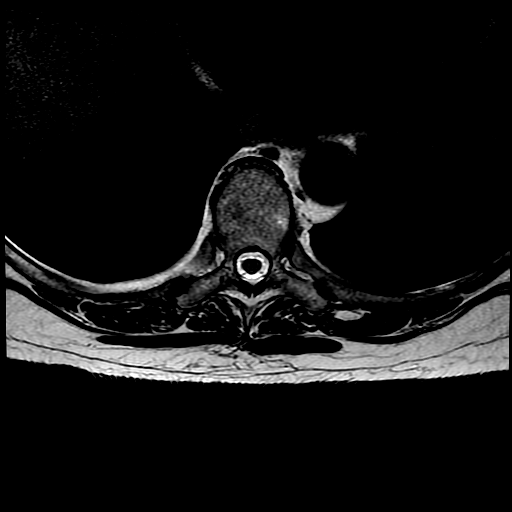
[im 37/49]
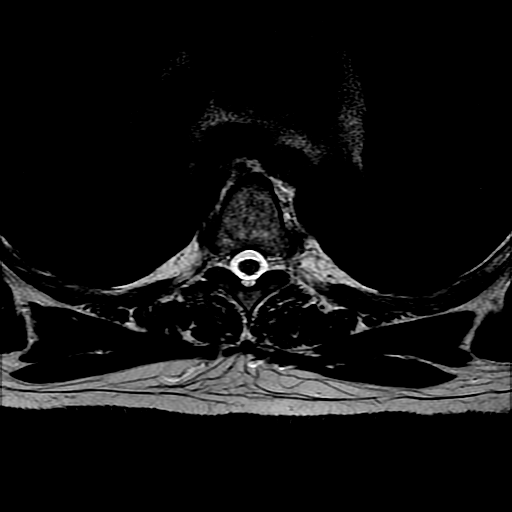
[im 49/49]
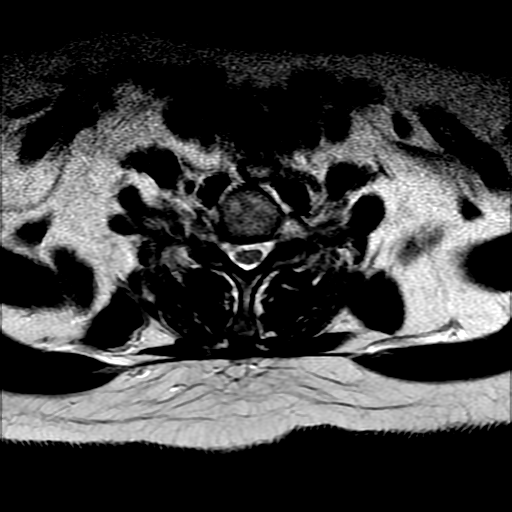

[Series 8: T1 · axial · non-contrast · 4.0mm · 0.39mm/px · z∈[-361,-321]mm · 2 of 51 slices shown (2 of 2)]
[im 1/51]
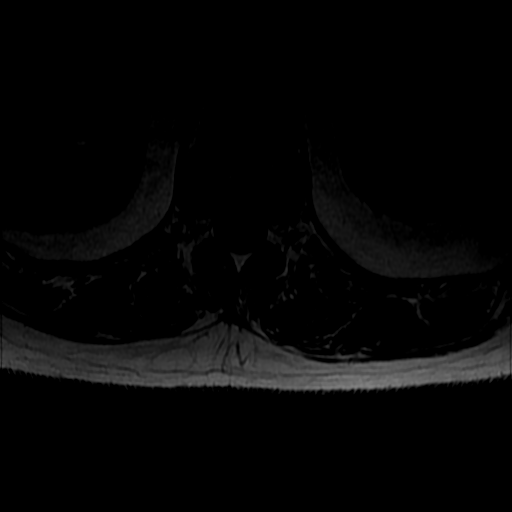
[im 9/51]
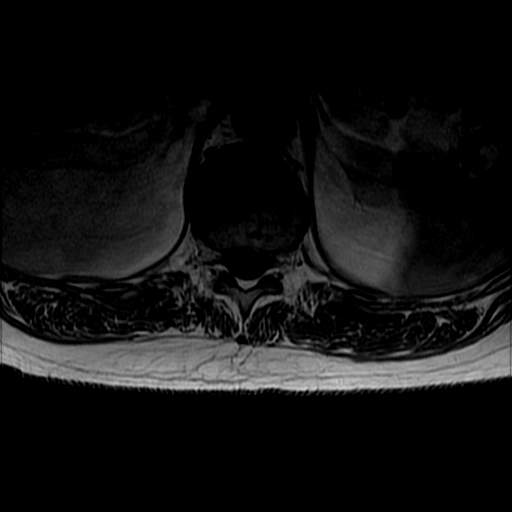

[Series 13: T2 · axial · 4.0mm · 0.39mm/px · z∈[-563,-359]mm · 5 of 35 slices shown (2 of 2)]
[im 1/35]
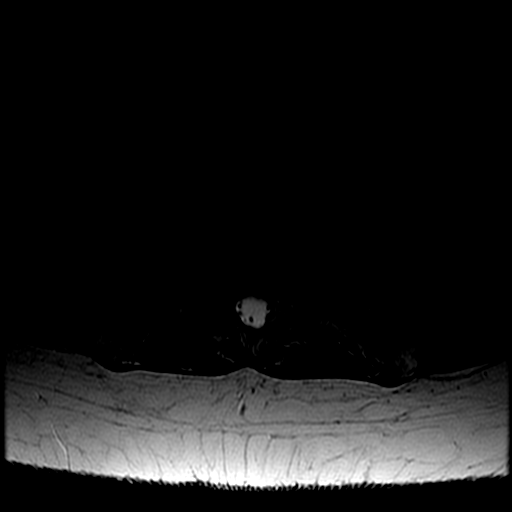
[im 9/35]
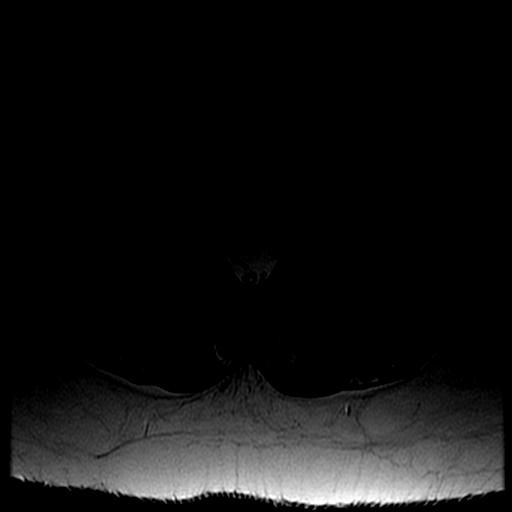
[im 18/35]
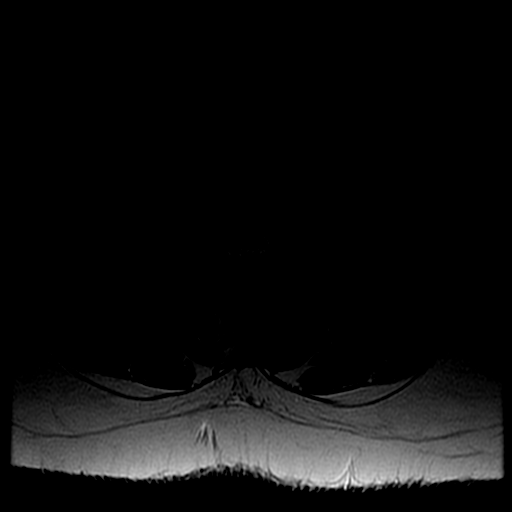
[im 26/35]
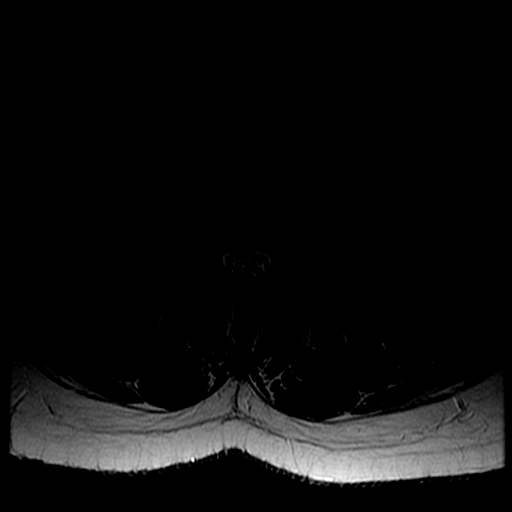
[im 35/35]
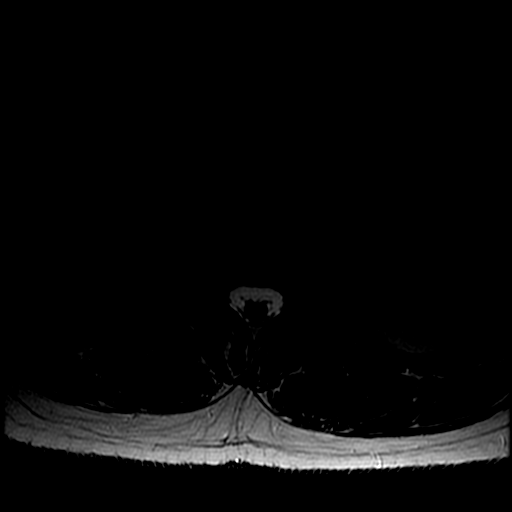

[Series 15: T2 post-contrast · sagittal · 4.0mm · 0.55mm/px · 2 of 13 slices shown (1 of 2)]
[im 1/13]
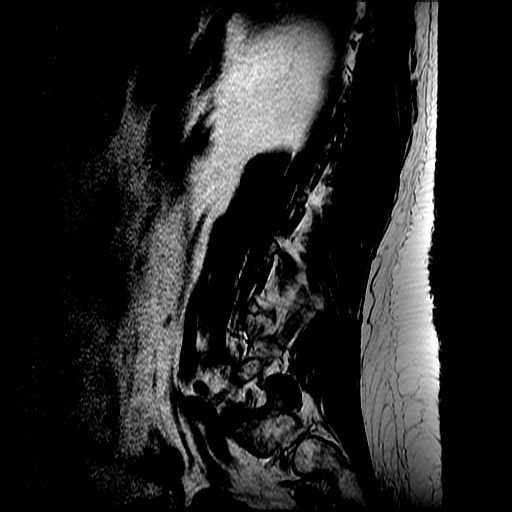
[im 13/13]
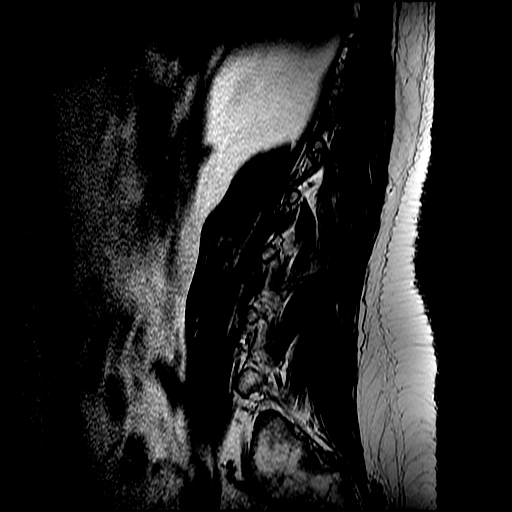

[Series 18: T2 post-contrast · sagittal · 3.0mm · 0.64mm/px · 2 of 13 slices shown (2 of 2)]
[im 1/13]
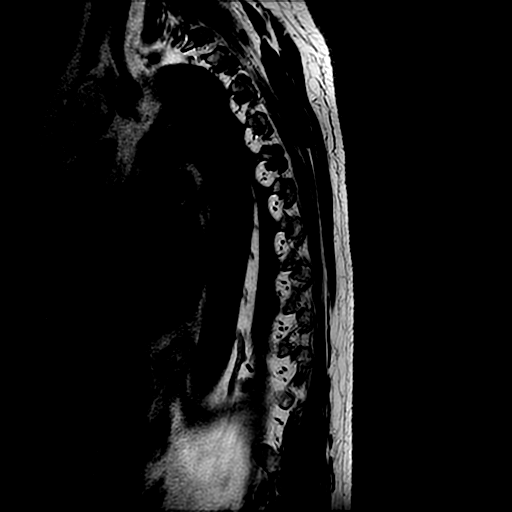
[im 13/13]
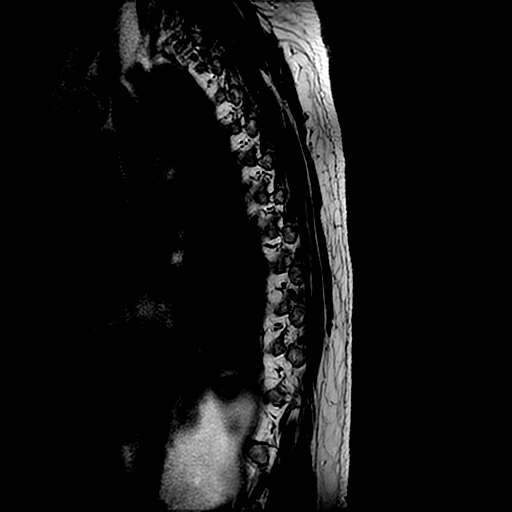

[17 of 48 positions shown; findings below may reference images not displayed]

FINDINGS: MR THORACIC SPINE FINDINGS

Limited sagittal imaging of the cervical spine remarkable for
straightening of lordosis and anterior degenerative disc osteophyte
complex at C6-C7.

Heterogeneous T1 bone marrow signal throughout the thoracic spine.
There are vertebral body lesions with stippled intrinsic T1
hyperintensity at T9 posteriorly and T11 centrally typical for
benign hemangioma.

The sclerotic T12 lesion also has heterogeneous intrinsic T1 signal
(series 5, image 6), but less than that seen those other 2 lesions.
The lesion has stippled internal heterogeneity on T1 and T2 typical
of hemangioma (series 7, image 43). Similar mild enhancement and
STIR hyperintensity as the T9 lesion.

Furthermore there is a similar T1 heterogeneous lesion along the
posterior superior T7 vertebral body with more intense enhancement
and STIR hyperintensity (series 19, image 6). However, the T2
appearance of this lesion also strongly resembles benign hemangioma.

Overall no destructive or suspicious thoracic vertebral lesion is
identified.

Spinal cord signal is within normal limits at all visualized levels.
No abnormal intradural enhancement. There are multiple small
degenerative disc protrusions in the thoracic spine, most pronounced
at T7-T8 (series 18, image 6 and series 7, image 24) but no
significant thoracic spinal stenosis.

Negative visualized thoracic and upper abdominal viscera. Posterior
paraspinal soft tissues appear normal.

MR LUMBAR SPINE FINDINGS

Sagittal lumbar spine images also include the T12 level and
intrinsic T1 hyperintensity within the sclerotic T12 lesion is seen
to better advantage on these images (series 11, image 6). A small
degenerative appearing T11 inferior endplate Schmorl node
incidentally also is more apparent with mild enhancement (series 16,
image 7).

Normal lumbar segmentation. There is a small 13-14 mm heterogeneous
intrinsic T1 lesion in the posterior L2 vertebral body compatible
with hemangioma. This demonstrates mild peripheral STIR
hyperintensity and slight enhancement (series 12, image 5).

Elsewhere mildly heterogeneous T1 signal in the lumbar spine and
sacrum with no suspicious marrow lesion.

Visualized lower thoracic spinal cord is normal with conus medularis
at L2. No abnormal intradural enhancement. No thickening of the
cauda equina nerve roots.

Mild/age congruent lumbar disc and facet degeneration at L1-L2 and
L2-L3. No significant lumbar spinal stenosis. Negative visualized
abdominal viscera.
IMPRESSION: 1. Multiple T1 heterogeneous thoracic and lumbar bone marrow
lesions, including that at T12 described recently by CT, which are
most compatible with benign hemangiomas. It is possible that the T12
lesion represents an "aggresive hemangioma" which can enlarge more
rapidly than usual, but such lesions are still benign. And overall
significance is doubtful.
2. No metastatic disease identified in the thoracic or lumbar spine.
3. Intermittent thoracic and occasional lumbar disc degeneration
without spinal stenosis.

## 2016-05-20 ENCOUNTER — Ambulatory Visit: Payer: BLUE CROSS/BLUE SHIELD | Attending: Gynecologic Oncology | Admitting: Gynecologic Oncology

## 2016-05-20 ENCOUNTER — Encounter: Payer: Self-pay | Admitting: Gynecologic Oncology

## 2016-05-20 VITALS — BP 136/89 | HR 63 | Temp 97.7°F | Resp 20 | Wt 193.0 lb

## 2016-05-20 DIAGNOSIS — Z8543 Personal history of malignant neoplasm of ovary: Secondary | ICD-10-CM | POA: Diagnosis not present

## 2016-05-20 DIAGNOSIS — Z9221 Personal history of antineoplastic chemotherapy: Secondary | ICD-10-CM

## 2016-05-20 DIAGNOSIS — C562 Malignant neoplasm of left ovary: Secondary | ICD-10-CM | POA: Insufficient documentation

## 2016-05-20 DIAGNOSIS — C569 Malignant neoplasm of unspecified ovary: Secondary | ICD-10-CM

## 2016-05-20 DIAGNOSIS — Z148 Genetic carrier of other disease: Secondary | ICD-10-CM

## 2016-05-20 DIAGNOSIS — E669 Obesity, unspecified: Secondary | ICD-10-CM

## 2016-05-20 NOTE — Patient Instructions (Signed)
Dr. Skeet Latch wants to see you in 6 months. Please call in late August to schedule your appointment.

## 2016-05-20 NOTE — Progress Notes (Signed)
GYNECOLOGIC ONCOLOGY OFFICE VISIT  CHIEF COMPLAINT: Ovarian carcinsarcoma  Surveillance,   ASSESSMENT/PLAN:  56 y.o.  with Stage IIB carcinosarcoma of the ovary No evidence of disease since completion of chemotherapy 11/2012 Will f/u with genetic counseling regarding information about her unknown variant. Follow-up with Gyn Oncology in 6 months    HISTORY OF PRESENT ILLNESS Morgan Olsen is a 56 y.o. woman who initially presented to the ER with pelvic pain.   A CT scan in the ED showed an 11cm complex left ovarian mass with mild mesenteric/omental stranding, small ascites, and peritoneal nodularity. She was referred to Dr. Gertie Fey, who performed a TVUS confirming an 11.5 cm complex left ovarian mass. No free fluid was seen and the uterus was otherwise normal. Her CA-125 was elevated at 1600.  Oncology Summary:  07/15/2012 Surgery TAH, BSO, ovarian cancer staging, appendectomy optimal debulking no gross residual disease  Intraoperative Findings: 10 cm Left ovary with cyst containing old blood and excrescences. The ovarian cyst was already ruptured upon entry to the abdomen. Adenocarcinoma of the ovary on frozen section: cannot rule out non-gyn primary. Left ovarian mass adherent to the uterine fundus and the sigmoid colon mesentery. Left ureter dilated to 1 cm prior to mass removal. Friable, dark soft tissue implants throughout the posterior cul-de-sac and adherent to the appendix. Bilateral pelvic adenopathy, largest 1 cm. Normal-appearing right tube and ovary. No masses noted on the small or large bowel, the mesentery, the liver edge, or the diaphragm. At the end of the case there was no residual disease.  Pathology: Ovary, left, oophorectomy - Carcinosarcoma, homologous type, consisting of mixed endometrioid, clear cell, mucinous, and squamous cell carcinoma components and homologous sarcomatous component.   Immediately post op underwent upper and lower endoscopy that was negative.    Genetic  testing notable for an unusual mutation of unknown significance.  Received 4 cyles Taxol/Carboplatin and then presented with c/o being unable to grasp and hold objects. Chemotherapy was then changed to docetaxol/carbo.  Last cycle of taxane/platin administered 11/2012  Lab Results  Component Value Date   CA125 15.6 09/10/2012  OSH CA125 06/13/2014 11   Morgan Olsen 04/2015 has intermittently reported LLQ the discomfort increasing and caliber. There is no radiation there is no other associated symptoms.  No weight loss no change in appetite no early satiety no abdominal bloating  CT 04/2015 IMPRESSION: 1. No evidence of soft tissue metastasis or explanation for left lower quadrant pain. 2. Enlargement of a heterogeneous, partially sclerotic T12 lesion. Morphology suggests at this may be a hemangioma. However, development since 2014 would be atypical. Isolated slow growing osseous metastasis from ovarian carcinoma would be unlikely. Consider further characterization with pre and postcontrast spine  MRI or bone scan.  Feels very well and is entirely without complaints. Excellent appetite, do discomfort, no change in bowel or bladder habits.  Past Medical History: . Hypertension  . Endometriosis  Identified at the time of cesarean section  . Asthma  . Melanoma in situ s/p excision  . Anxiety   . Ovarian cancer 06/2012   Past Surgical History:  Procedure Laterality Date  . ABDOMINAL HYSTERECTOMY  07/15/2012   @ [facility] with oophorectomy  . ABLATION    . APPENDECTOMY    . CESAREAN SECTION    . TONSILLECTOMY     . Endometrial ablation 2011 For menorrhagia  RESECTION(INITIAL) OVARIAN/TUBAL/PRIMA PERTONL MALIG W/BIL S&O, OMNTECT; W/TAH/LTD PARA-AORTIC LYMPHADENECT:   .COLONOSCOPY, FLEXIBLE, PROXIMAL TO SPLENIC FLEXURE; DIAGNOSTIC, W/WO COLLECTION SPECIMEN BY BRUSH OR  [place];  07/17/2012  . Melanoma insitu resection Left 2007 resected from underside of left forearm    Family History  . Colon  cancer Other 41  . Uterine cancer Other 54  . Colon cancer Father 32 one polyp  . Cancer Mother Thyroid  . Asthma Mother  . Ovarian cancer Other 74  . Stroke Paternal Grandmother   Social History Daughter moved into  [employer] college on a swim scholarship and  is having seqalae of a concussion.  Returned from a family trip to [place] for a music festival.  Husband is well.    REVIEW OF SYSTEMS Constitutional  Feels well, weight gain Cardiovascular  No chest pain, shortness of breath, or edema  Pulmonary  No cough or wheeze.  Gastro Intestinal  No nausea, vomitting, or diarrhoea. No bright red blood per rectum, No change in bowel movement, or constipation.  Genito Urinary  No frequency, urgency, dysuria, no vaginal bleeding Musculo Skeletal  No myalgia, arthralgia, joint swelling or pain  Neurologic  No weakness, numbness, Psychology  No depression, anxiety,   PHYSICAL EXAMINATION: BP 136/89 (BP Location: Left Arm, Patient Position: Sitting)   Pulse 63   Temp 97.7 F (36.5 C) (Oral)   Resp 20   Wt 193 lb (87.5 kg)   BMI 28.09 kg/m  Wt Readings from Last 3 Encounters:  05/20/16 193 lb (87.5 kg)  11/16/15 193 lb 9.6 oz (87.8 kg)  08/17/15 193 lb 1.6 oz (87.6 kg)  WD female in NAD looks happy HEENT: Normal ROM, Sclera non icteric CHEST:  CTA CARDIAC: RRR ABD:  Soft NT, Incision C/D/I no masses, no fluid wave, non tender. No rebound or guarding BACK:  No CVAT LN:  No cervical supraclavicular or inguinal adenopathy PELVIC:  Nl EGBUS, no vaginal bleeding or discharge, atrophic vagina no masses, no cul de sac nodularity Rectal:  Good tone no masses, no nodularity EXT: 1+ edema bilaterally PSYCH:  Appropriate mood affect

## 2016-11-07 ENCOUNTER — Encounter: Payer: Self-pay | Admitting: Family Medicine

## 2016-11-28 ENCOUNTER — Telehealth: Payer: Self-pay | Admitting: *Deleted

## 2016-11-28 NOTE — Telephone Encounter (Signed)
Contacted the patient to schedule a follow up appt for Dr. Skeet Latch. Appt scheduled for November 29th at 11am.

## 2017-01-16 ENCOUNTER — Encounter: Payer: Self-pay | Admitting: Gynecologic Oncology

## 2017-01-16 ENCOUNTER — Ambulatory Visit: Payer: BLUE CROSS/BLUE SHIELD | Attending: Gynecologic Oncology | Admitting: Gynecologic Oncology

## 2017-01-16 VITALS — BP 120/94 | HR 71 | Temp 98.0°F | Resp 20 | Ht 69.5 in | Wt 200.6 lb

## 2017-01-16 DIAGNOSIS — Z08 Encounter for follow-up examination after completed treatment for malignant neoplasm: Secondary | ICD-10-CM | POA: Diagnosis not present

## 2017-01-16 DIAGNOSIS — Z9889 Other specified postprocedural states: Secondary | ICD-10-CM | POA: Diagnosis not present

## 2017-01-16 DIAGNOSIS — F419 Anxiety disorder, unspecified: Secondary | ICD-10-CM | POA: Insufficient documentation

## 2017-01-16 DIAGNOSIS — I10 Essential (primary) hypertension: Secondary | ICD-10-CM | POA: Diagnosis not present

## 2017-01-16 DIAGNOSIS — J45909 Unspecified asthma, uncomplicated: Secondary | ICD-10-CM | POA: Diagnosis not present

## 2017-01-16 DIAGNOSIS — Z9221 Personal history of antineoplastic chemotherapy: Secondary | ICD-10-CM | POA: Diagnosis not present

## 2017-01-16 DIAGNOSIS — Z808 Family history of malignant neoplasm of other organs or systems: Secondary | ICD-10-CM | POA: Insufficient documentation

## 2017-01-16 DIAGNOSIS — Z8543 Personal history of malignant neoplasm of ovary: Secondary | ICD-10-CM | POA: Insufficient documentation

## 2017-01-16 DIAGNOSIS — C569 Malignant neoplasm of unspecified ovary: Secondary | ICD-10-CM

## 2017-01-16 DIAGNOSIS — C562 Malignant neoplasm of left ovary: Secondary | ICD-10-CM | POA: Diagnosis present

## 2017-01-16 DIAGNOSIS — R102 Pelvic and perineal pain: Secondary | ICD-10-CM | POA: Diagnosis not present

## 2017-01-16 NOTE — Progress Notes (Signed)
GYNECOLOGIC ONCOLOGY OFFICE VISIT  CHIEF COMPLAINT: Ovarian carcinsarcoma  Surveillance,   ASSESSMENT/PLAN:  56 y.o.  with Stage IIB carcinosarcoma of the ovary No evidence of disease since completion of chemotherapy 11/2012 Will f/u with genetic counseling regarding information about her unknown variant. Follow-up with Gyn Oncology in 6 months    HISTORY OF PRESENT ILLNESS Morgan Olsen is a 56 y.o. woman who initially presented to the ER with pelvic pain.   A CT scan in the ED showed an 11cm complex left ovarian mass with mild mesenteric/omental stranding, small ascites, and peritoneal nodularity. She was referred to Dr. Gertie Fey, who performed a TVUS confirming an 11.5 cm complex left ovarian mass. No free fluid was seen and the uterus was otherwise normal. Her CA-125 was elevated at 1600.  Oncology Summary:  07/15/2012 Surgery TAH, BSO, ovarian cancer staging, appendectomy optimal debulking no gross residual disease  Intraoperative Findings: 10 cm Left ovary with cyst containing old blood and excrescences. The ovarian cyst was already ruptured upon entry to the abdomen. Adenocarcinoma of the ovary on frozen section: cannot rule out non-gyn primary. Left ovarian mass adherent to the uterine fundus and the sigmoid colon mesentery. Left ureter dilated to 1 cm prior to mass removal. Friable, dark soft tissue implants throughout the posterior cul-de-sac and adherent to the appendix. Bilateral pelvic adenopathy, largest 1 cm. Normal-appearing right tube and ovary. No masses noted on the small or large bowel, the mesentery, the liver edge, or the diaphragm. At the end of the case there was no residual disease.  Pathology: Ovary, left, oophorectomy - Carcinosarcoma, homologous type, consisting of mixed endometrioid, clear cell, mucinous, and squamous cell carcinoma components and homologous sarcomatous component.   Immediately post op underwent upper and lower endoscopy that was negative.    Genetic  testing notable for an unusual mutation of unknown significance.  Received 4 cyles Taxol/Carboplatin and then presented with c/o being unable to grasp and hold objects. Chemotherapy was then changed to docetaxol/carbo.  Last cycle of taxane/platin administered 11/2012  Lab Results  Component Value Date   CA125 15.6 09/10/2012  OSH CA125 06/13/2014 11   Ms. [place] 04/2015 has intermittently reported LLQ the discomfort increasing and caliber. There is no radiation there is no other associated symptoms.  No weight loss no change in appetite no early satiety no abdominal bloating  CT 04/2015 IMPRESSION: 1. No evidence of soft tissue metastasis or explanation for left lower quadrant pain. 2. Enlargement of a heterogeneous, partially sclerotic T12 lesion. Morphology suggests at this may be a hemangioma. However, development since 2014 would be atypical. Isolated slow growing osseous metastasis from ovarian carcinoma would be unlikely. Consider further characterization with pre and postcontrast spine  MRI or bone scan.  Feels very well and is entirely without complaints. Excellent appetite, do discomfort, no change in bowel or bladder habits. Left lower quadrant intermittent pelvic pain is unchanged. Has no complaints is trying to lose weight   Past Medical History: . Hypertension  . Endometriosis  Identified at the time of cesarean section  . Asthma  . Melanoma in situ s/p excision  . Anxiety   . Ovarian cancer 06/2012   Past Surgical History:  Procedure Laterality Date  . ABDOMINAL HYSTERECTOMY  07/15/2012   @ [facility] with oophorectomy  . ABLATION    . APPENDECTOMY    . CESAREAN SECTION    . TONSILLECTOMY     . Endometrial ablation 2011 For menorrhagia  RESECTION(INITIAL) OVARIAN/TUBAL/PRIMA PERTONL MALIG W/BIL S&O, OMNTECT; W/TAH/LTD  PARA-AORTIC LYMPHADENECT:   .COLONOSCOPY, FLEXIBLE, PROXIMAL TO SPLENIC FLEXURE; DIAGNOSTIC, W/WO COLLECTION SPECIMEN BY BRUSH OR [place];  07/17/2012  .  Melanoma insitu resection Left 2007 resected from underside of left forearm    Family History  . Colon cancer Other 25  . Uterine cancer Other 54  . Colon cancer Father 3 one polyp  . Cancer Mother Thyroid  . Asthma Mother  . Ovarian cancer Other 29  . Stroke Paternal Grandmother   Social History Family and daughter are doing well  REVIEW OF SYSTEMS Constitutional  Feels well, Cardiovascular  No chest pain, shortness of breath, or edema  Pulmonary  No cough or wheeze.  Gastro Intestinal  No nausea, vomitting, or diarrhoea. No bright red blood per rectum, No change in bowel movement, or constipation.  Genito Urinary  No frequency, urgency, dysuria, no vaginal bleeding Musculo Skeletal  No myalgia, arthralgia, joint swelling or pain  Neurologic  No weakness, numbness, Psychology  No depression, anxiety,   PHYSICAL EXAMINATION: BP (!) 120/94 (BP Location: Left Arm, Patient Position: Sitting)   Pulse 71   Temp 98 F (36.7 C) (Oral)   Resp 20   Ht 5' 9.5" (1.765 m)   Wt 200 lb 9.6 oz (91 kg)   SpO2 99%   BMI 29.20 kg/m  Wt Readings from Last 3 Encounters:  01/16/17 200 lb 9.6 oz (91 kg)  05/20/16 193 lb (87.5 kg)  11/16/15 193 lb 9.6 oz (87.8 kg)  WD female in NAD looks happy HEENT: Normal ROM, Sclera non icteric CHEST:  CTA CARDIAC: RRR ABD:  Soft NT, Incision C/D/I no masses, no fluid wave, non tender. No rebound or guarding BACK:  No CVAT LN:  No cervical supraclavicular or inguinal adenopathy PELVIC:  Nl EGBUS, no vaginal bleeding or discharge, atrophic vagina no masses, no cul de sac nodularity Rectal:  Good tone no masses, no nodularity EXT: 1+ edema bilaterally PSYCH:  Appropriate mood affect

## 2017-01-16 NOTE — Patient Instructions (Signed)
Follow up in  6 months with Dr. Skeet Latch.

## 2017-06-13 ENCOUNTER — Other Ambulatory Visit: Payer: Self-pay | Admitting: Obstetrics and Gynecology

## 2017-06-13 DIAGNOSIS — Z1231 Encounter for screening mammogram for malignant neoplasm of breast: Secondary | ICD-10-CM

## 2017-07-03 ENCOUNTER — Inpatient Hospital Stay: Payer: BLUE CROSS/BLUE SHIELD

## 2017-07-03 ENCOUNTER — Inpatient Hospital Stay: Payer: BLUE CROSS/BLUE SHIELD | Attending: Gynecologic Oncology | Admitting: Gynecologic Oncology

## 2017-07-03 VITALS — BP 141/92 | HR 63 | Temp 98.3°F | Resp 18 | Ht 69.5 in | Wt 201.8 lb

## 2017-07-03 DIAGNOSIS — Z8543 Personal history of malignant neoplasm of ovary: Secondary | ICD-10-CM | POA: Insufficient documentation

## 2017-07-03 DIAGNOSIS — Z800 Family history of malignant neoplasm of digestive organs: Secondary | ICD-10-CM | POA: Insufficient documentation

## 2017-07-03 DIAGNOSIS — F419 Anxiety disorder, unspecified: Secondary | ICD-10-CM | POA: Diagnosis not present

## 2017-07-03 DIAGNOSIS — Z86010 Personal history of colonic polyps: Secondary | ICD-10-CM | POA: Diagnosis not present

## 2017-07-03 DIAGNOSIS — C569 Malignant neoplasm of unspecified ovary: Secondary | ICD-10-CM

## 2017-07-03 DIAGNOSIS — I10 Essential (primary) hypertension: Secondary | ICD-10-CM

## 2017-07-03 DIAGNOSIS — Z90710 Acquired absence of both cervix and uterus: Secondary | ICD-10-CM | POA: Diagnosis not present

## 2017-07-03 DIAGNOSIS — G8929 Other chronic pain: Secondary | ICD-10-CM

## 2017-07-03 DIAGNOSIS — R1032 Left lower quadrant pain: Secondary | ICD-10-CM | POA: Insufficient documentation

## 2017-07-03 DIAGNOSIS — E669 Obesity, unspecified: Secondary | ICD-10-CM

## 2017-07-03 DIAGNOSIS — J45909 Unspecified asthma, uncomplicated: Secondary | ICD-10-CM

## 2017-07-03 DIAGNOSIS — Z85820 Personal history of malignant melanoma of skin: Secondary | ICD-10-CM | POA: Diagnosis not present

## 2017-07-03 DIAGNOSIS — Z90722 Acquired absence of ovaries, bilateral: Secondary | ICD-10-CM | POA: Insufficient documentation

## 2017-07-03 DIAGNOSIS — R109 Unspecified abdominal pain: Secondary | ICD-10-CM

## 2017-07-03 LAB — BASIC METABOLIC PANEL
Anion gap: 7 (ref 3–11)
BUN: 12 mg/dL (ref 7–26)
CO2: 29 mmol/L (ref 22–29)
Calcium: 10.2 mg/dL (ref 8.4–10.4)
Chloride: 103 mmol/L (ref 98–109)
Creatinine, Ser: 0.79 mg/dL (ref 0.60–1.10)
GFR calc Af Amer: >60 mL/min (ref >60)
GFR calc non Af Amer: >60 mL/min (ref >60)
Glucose, Bld: 102 mg/dL (ref 70–140)
Potassium: 4.3 mmol/L (ref 3.5–5.1)
Sodium: 139 mmol/L (ref 136–145)

## 2017-07-03 NOTE — Patient Instructions (Signed)
Plan to have a CT scan of the abdomen and pelvis and we will contact you with the results.  Please give [place] a call for any questions or concerns.

## 2017-07-03 NOTE — Progress Notes (Signed)
GYNECOLOGIC ONCOLOGY OFFICE VISIT  CHIEF COMPLAINT: Ovarian carcinsarcoma  Surveillance, left lower quadrant pain  ASSESSMENT/PLAN:  57 y.o.  with Stage IIB carcinosarcoma of the ovary No evidence of disease since completion of chemotherapy 11/2012  Constant left lower quadrant pain No masses identified on examination but discomfort was present.  CT of abdomen and pelvis Follow-up with Gyn Oncology in [place] Morgan Olsen is a 57 y.o. woman who initially presented to the ER with pelvic pain.   A CT scan in the ED showed an 11cm complex left ovarian mass with mild mesenteric/omental stranding, small ascites, and peritoneal nodularity. She was referred to Dr. Gertie Fey, who performed a TVUS confirming an 11.5 cm complex left ovarian mass. No free fluid was seen and the uterus was otherwise normal. Her CA-125 was elevated at 1600.  Oncology Summary:  07/15/2012 Surgery TAH, BSO, ovarian cancer staging, appendectomy optimal debulking no gross residual disease  Intraoperative Findings: 10 cm Left ovary with cyst containing old blood and excrescences. The ovarian cyst was already ruptured upon entry to the abdomen. Adenocarcinoma of the ovary on frozen section: cannot rule out non-gyn primary. Left ovarian mass adherent to the uterine fundus and the sigmoid colon mesentery. Left ureter dilated to 1 cm prior to mass removal. Friable, dark soft tissue implants throughout the posterior cul-de-sac and adherent to the appendix. Bilateral pelvic adenopathy, largest 1 cm. Normal-appearing right tube and ovary. No masses noted on the small or large bowel, the mesentery, the liver edge, or the diaphragm. At the end of the case there was no residual disease.  Pathology: Ovary, left, oophorectomy - Carcinosarcoma, homologous type, consisting of mixed endometrioid, clear cell, mucinous, and squamous cell carcinoma components and homologous sarcomatous component.   Immediately post op  underwent upper and lower endoscopy that was negative.    Genetic testing notable for an unusual mutation of unknown significance. Germline testing BRIP1 Gene Mutation Variant of Unknown Clinical Significance:   [facility] SEQ 04/14/2017  Tumor molecular testing PIK3R1:c. 2034delC [p.Tyr679Metfs] PTEN:c.69delA [p.Asp24Thrfs] PTEN:c.407G>T [p.Cys136Phe] TP53:c.742C>T [p.Arg248Trp]  Received 4 cyles Taxol/Carboplatin and then presented with c/o being unable to grasp and hold objects. Chemotherapy was then changed to docetaxol/carbo.  Last cycle of taxane/platin administered 11/2012  Lab Results  Component Value Date   CA125 15.6 09/10/2012  OSH CA125 06/13/2014 11   Ms. [place] 04/2015 has intermittently reported LLQ the discomfort increasing and caliber. There is no radiation there is no other associated symptoms.  No weight loss no change in appetite no early satiety no abdominal bloating  CT 04/2015 IMPRESSION: 1. No evidence of soft tissue metastasis or explanation for left lower quadrant pain. 2. Enlargement of a heterogeneous, partially sclerotic T12 lesion. Morphology suggests at this may be a hemangioma. However, development since 2014 would be atypical. Isolated slow growing osseous metastasis from ovarian carcinoma would be unlikely. Consider further characterization with pre and postcontrast spine  MRI or bone scan.  Feels very well and is entirely without complaints. Excellent appetite, do discomfort, no change in bowel or bladder habits. Left lower quadrant intermittent pelvic pain is unchanged in tenor, but is now constant.  Denies constipation denies fever. No bloating,  Past Medical History: . Hypertension  . Endometriosis  Identified at the time of cesarean section  . Asthma  . Melanoma in situ s/p excision  . Anxiety   . Ovarian cancer 06/2012   Past Surgical History:  Procedure Laterality Date  . ABDOMINAL HYSTERECTOMY  07/15/2012   @  [facility] with  oophorectomy  . ABLATION    . APPENDECTOMY    . CESAREAN SECTION    . TONSILLECTOMY     . Endometrial ablation 2011 For menorrhagia  RESECTION(INITIAL) OVARIAN/TUBAL/PRIMA PERTONL MALIG W/BIL S&O, OMNTECT; W/TAH/LTD PARA-AORTIC LYMPHADENECT:   .COLONOSCOPY, FLEXIBLE, PROXIMAL TO SPLENIC FLEXURE; DIAGNOSTIC, W/WO COLLECTION SPECIMEN BY BRUSH OR [place];  07/17/2012  . Melanoma insitu resection Left 2007 resected from underside of left forearm    Family History  . Colon cancer Other 84  . Uterine cancer Other 54  . Colon cancer Father 1 one polyp  . Cancer Mother Thyroid  . Asthma Mother  . Ovarian cancer Other 71  . Stroke Paternal Grandmother   Social History Family and daughter are doing well, her husband has a Engineer, petroleum and so the family is moving to [place] in August.  REVIEW OF SYSTEMS Constitutional  Feels well, Cardiovascular  No chest pain, shortness of breath, or edema  Pulmonary  No cough or wheeze.  Gastro Intestinal  No nausea, vomitting, or diarrhoea. No bright red blood per rectum, No change in bowel movement, or constipation.  Constant low-grade left lower quadrant discomfort  Genito Urinary  No frequency, urgency, dysuria, no vaginal bleeding Musculo Skeletal  No myalgia, arthralgia, joint swelling or pain  Neurologic  No weakness, numbness, Psychology  No depression, anxiety,   PHYSICAL EXAMINATION: BP (!) 141/92 (BP Location: Left Arm, Patient Position: Sitting)   Pulse 63   Temp 98.3 F (36.8 C) (Oral)   Resp 18   Ht 5' 9.5" (1.765 m)   Wt 201 lb 12.8 oz (91.5 kg)   SpO2 99%   BMI 29.37 kg/m  Wt Readings from Last 3 Encounters:  07/03/17 201 lb 12.8 oz (91.5 kg)  01/16/17 200 lb 9.6 oz (91 kg)  05/20/16 193 lb (87.5 kg)  WD female in NAD looks happy HEENT: Normal ROM, Sclera non icteric CHEST:  CTA CARDIAC: RRR ABD:  Soft NT, Incision C/D/I no masses, no fluid wave, non tender. No rebound or guarding BACK:  No CVAT LN:   No cervical supraclavicular or inguinal adenopathy PELVIC:  Nl EGBUS, no vaginal bleeding or discharge, atrophic vagina no masses, no cul de sac nodularity Rectal:  Good tone no masses, no nodularity, no tenderness EXT: 1+ edema bilaterally PSYCH:  Appropriate mood affect

## 2017-07-07 ENCOUNTER — Ambulatory Visit
Admission: RE | Admit: 2017-07-07 | Discharge: 2017-07-07 | Disposition: A | Discharge status: Home / Self Care | Payer: BLUE CROSS/BLUE SHIELD | Source: Ambulatory Visit | Attending: Obstetrics and Gynecology | Admitting: Obstetrics and Gynecology

## 2017-07-07 DIAGNOSIS — Z1231 Encounter for screening mammogram for malignant neoplasm of breast: Secondary | ICD-10-CM

## 2017-07-09 ENCOUNTER — Ambulatory Visit (HOSPITAL_COMMUNITY)
Admission: RE | Admit: 2017-07-09 | Discharge: 2017-07-09 | Disposition: A | Discharge status: Home / Self Care | Payer: BLUE CROSS/BLUE SHIELD | Source: Ambulatory Visit | Attending: Gynecologic Oncology | Admitting: Gynecologic Oncology

## 2017-07-09 DIAGNOSIS — R109 Unspecified abdominal pain: Secondary | ICD-10-CM | POA: Diagnosis not present

## 2017-07-09 DIAGNOSIS — I700 Atherosclerosis of aorta: Secondary | ICD-10-CM | POA: Diagnosis not present

## 2017-07-09 DIAGNOSIS — K760 Fatty (change of) liver, not elsewhere classified: Secondary | ICD-10-CM | POA: Insufficient documentation

## 2017-07-09 DIAGNOSIS — C569 Malignant neoplasm of unspecified ovary: Secondary | ICD-10-CM | POA: Insufficient documentation

## 2017-07-09 MED ORDER — IOPAMIDOL (ISOVUE-300) INJECTION 61%
INTRAVENOUS | Status: AC
  Filled 2017-07-09: qty 30

## 2017-07-09 MED ORDER — IOPAMIDOL (ISOVUE-300) INJECTION 61%
100.0000 mL | Freq: Once | INTRAVENOUS | Status: AC | PRN
  Administered 2017-07-09: 100 mL via INTRAVENOUS

## 2017-07-09 MED ORDER — IOPAMIDOL (ISOVUE-300) INJECTION 61%
30.0000 mL | Freq: Once | INTRAVENOUS | Status: AC | PRN
  Administered 2017-07-09: 30 mL via ORAL

## 2017-07-09 MED ORDER — IOPAMIDOL (ISOVUE-300) INJECTION 61%
INTRAVENOUS | Status: AC
  Filled 2017-07-09: qty 100
